# Patient Record
Sex: Female | Born: 1995 | Race: Black or African American | Hispanic: No | Marital: Single | Smoking: Never smoker
Health system: Southern US, Community
[De-identification: ages and names within clinical notes are randomized; demographics above are authoritative.]

## PROBLEM LIST (undated history)

## (undated) ENCOUNTER — Inpatient Hospital Stay (HOSPITAL_COMMUNITY): Payer: Medicaid Other

## (undated) DIAGNOSIS — O24419 Gestational diabetes mellitus in pregnancy, unspecified control: Secondary | ICD-10-CM

## (undated) DIAGNOSIS — Z789 Other specified health status: Secondary | ICD-10-CM

## (undated) HISTORY — PX: NO PAST SURGERIES: SHX2092

---

## 2014-05-19 ENCOUNTER — Emergency Department (HOSPITAL_COMMUNITY): Payer: Medicaid Other

## 2014-05-19 ENCOUNTER — Emergency Department (HOSPITAL_COMMUNITY)
Admission: EM | Admit: 2014-05-19 | Discharge: 2014-05-19 | Disposition: A | Discharge status: Home / Self Care | Payer: Medicaid Other | Attending: Emergency Medicine | Admitting: Emergency Medicine

## 2014-05-19 ENCOUNTER — Encounter (HOSPITAL_COMMUNITY): Payer: Self-pay | Admitting: Emergency Medicine

## 2014-05-19 DIAGNOSIS — S8990XA Unspecified injury of unspecified lower leg, initial encounter: Secondary | ICD-10-CM | POA: Insufficient documentation

## 2014-05-19 DIAGNOSIS — S99919A Unspecified injury of unspecified ankle, initial encounter: Secondary | ICD-10-CM | POA: Diagnosis not present

## 2014-05-19 DIAGNOSIS — Y9389 Activity, other specified: Secondary | ICD-10-CM | POA: Insufficient documentation

## 2014-05-19 DIAGNOSIS — Y92410 Unspecified street and highway as the place of occurrence of the external cause: Secondary | ICD-10-CM | POA: Diagnosis not present

## 2014-05-19 DIAGNOSIS — IMO0002 Reserved for concepts with insufficient information to code with codable children: Secondary | ICD-10-CM | POA: Diagnosis not present

## 2014-05-19 DIAGNOSIS — S99929A Unspecified injury of unspecified foot, initial encounter: Principal | ICD-10-CM
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
## 2014-05-19 NOTE — ED Notes (Signed)
Pt is not available to be triaged. Pt is talking on cell phone.
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
## 2014-05-22 NOTE — ED Provider Notes (Signed)
Medical screening examination/treatment/procedure(s) were performed by non-physician practitioner and as supervising physician I was immediately available for consultation/collaboration.   EKG Interpretation None        Mea Ozga, MD 05/22/14 0658 

## 2014-11-17 ENCOUNTER — Other Ambulatory Visit: Payer: Self-pay | Admitting: Emergency Medicine

## 2014-11-17 DIAGNOSIS — Z975 Presence of (intrauterine) contraceptive device: Secondary | ICD-10-CM

## 2014-11-22 ENCOUNTER — Other Ambulatory Visit: Payer: Medicaid Other

## 2016-11-10 ENCOUNTER — Encounter (HOSPITAL_COMMUNITY): Payer: Self-pay | Admitting: Emergency Medicine

## 2016-11-10 ENCOUNTER — Emergency Department (HOSPITAL_COMMUNITY)
Admission: EM | Admit: 2016-11-10 | Discharge: 2016-11-10 | Disposition: A | Discharge status: Home / Self Care | Payer: Medicaid Other | Attending: Emergency Medicine | Admitting: Emergency Medicine

## 2016-11-10 DIAGNOSIS — R0981 Nasal congestion: Secondary | ICD-10-CM | POA: Diagnosis present

## 2016-11-10 DIAGNOSIS — J3489 Other specified disorders of nose and nasal sinuses: Secondary | ICD-10-CM | POA: Diagnosis not present

## 2016-11-10 MED ORDER — FLUTICASONE PROPIONATE 50 MCG/ACT NA SUSP: 2.0000 | Freq: Every day | NASAL | 2 refills | Status: DC

## 2016-11-10 MED ORDER — CETIRIZINE HCL 10 MG PO TABS: 10.0000 mg | ORAL_TABLET | Freq: Every day | ORAL | 0 refills | Status: DC

## 2016-11-10 NOTE — ED Triage Notes (Signed)
Pt. Stated, I've been sneezing and having congestion since Friday.

## 2016-11-10 NOTE — Discharge Instructions (Signed)
You have been seen today for congestion and sneezing. Use the Flonase and Zyrtec daily, as directed. Stay well-hydrated. Follow up with a primary care provider for any future management of this issue.

## 2016-11-10 NOTE — ED Provider Notes (Signed)
[facility]-EMERGENCY DEPT Provider Note   CSN: [id number] Arrival date & time: 11/10/16  1013     History   Chief Complaint Chief Complaint  Patient presents with  . Nasal Congestion    HPI Lorraine Burton is a 21 y.o. female.  HPI   Lorraine Burton is a 21 y.o. female, patient with no pertinent past medical history, presenting to the ED with nasal congestion, sneezing, and rhinorrhea for the last three days. She has not tried any treatments for her symptoms. Denies fever/chills, vomiting/diarrhea, CP, SOB, HA, or any other complaints.    History reviewed. No pertinent past medical history.  There are no active problems to display for this patient.   History reviewed. No pertinent surgical history.  OB History    No data available       Home Medications    Prior to Admission medications   Medication Sig Start Date End Date Taking? Authorizing Provider  cetirizine (ZYRTEC ALLERGY) 10 MG tablet Take 1 tablet (10 mg total) by mouth daily. 11/10/16   Alexiya Franqui C Sani Madariaga, PA-C  fluticasone (FLONASE) 50 MCG/ACT nasal spray Place 2 sprays into both nostrils daily. 11/10/16   Anselm PancoastShawn C Emmarose Klinke, PA-C    Family History No family history on file.  Social History Social History  Substance Use Topics  . Smoking status: Never Smoker  . Smokeless tobacco: Never Used  . Alcohol use No     Allergies   Patient has no known allergies.   Review of Systems Review of Systems  Constitutional: Negative for chills and fever.  HENT: Positive for congestion, rhinorrhea and sneezing. Negative for ear pain, sore throat and trouble swallowing.   Respiratory: Negative for cough and shortness of breath.   Gastrointestinal: Negative for diarrhea and vomiting.  Neurological: Negative for headaches.     Physical Exam Updated Vital Signs BP 98/70 (BP Location: Left Arm)   Pulse 93   Temp 98.8 F (37.1 C) (Oral)   Resp 18   Ht 5\' 8"  (1.727 m)   Wt 63.5 kg   LMP 10/19/2016   SpO2 100%   BMI  21.29 kg/m   Physical Exam  Constitutional: She appears well-developed and well-nourished. No distress.  HENT:  Head: Normocephalic and atraumatic.  Nose: Mucosal edema and rhinorrhea present. No sinus tenderness. Right sinus exhibits no maxillary sinus tenderness and no frontal sinus tenderness. Left sinus exhibits no maxillary sinus tenderness and no frontal sinus tenderness.  Mouth/Throat: Oropharynx is clear and moist.  Eyes: Conjunctivae are normal.  Neck: Normal range of motion. Neck supple.  Cardiovascular: Normal rate and regular rhythm.   Pulmonary/Chest: Effort normal and breath sounds normal. No respiratory distress.  Lymphadenopathy:    She has no cervical adenopathy.  Neurological: She is alert.  Skin: Skin is warm and dry. She is not diaphoretic.  Psychiatric: She has a normal mood and affect. Her behavior is normal.  Nursing note and vitals reviewed.    ED Treatments / Results  Labs (all labs ordered are listed, but only abnormal results are displayed) Labs Reviewed - No data to display  EKG  EKG Interpretation None       Radiology No results found.  Procedures Procedures (including critical care time)  Medications Ordered in ED Medications - No data to display   Initial Impression / Assessment and Plan / ED Course  I have reviewed the triage vital signs and the nursing notes.  Pertinent labs & imaging results that were available during my care of  the patient were reviewed by me and considered in my medical decision making (see chart for details).      Patient presents with nasal congestion and rhinorrhea. PCP follow-up for chronic management recommended. Home management discussed.     Final Clinical Impressions(s) / ED Diagnoses   Final diagnoses:  Nasal congestion  Rhinorrhea    New Prescriptions New Prescriptions   CETIRIZINE (ZYRTEC ALLERGY) 10 MG TABLET    Take 1 tablet (10 mg total) by mouth daily.   FLUTICASONE (FLONASE) 50  MCG/ACT NASAL SPRAY    Place 2 sprays into both nostrils daily.     Anselm Pancoast, PA-C 11/10/16 1103    Gerhard Munch, MD 11/11/16 1014

## 2016-12-06 ENCOUNTER — Encounter (HOSPITAL_COMMUNITY): Payer: Self-pay | Admitting: Emergency Medicine

## 2016-12-06 ENCOUNTER — Emergency Department (HOSPITAL_COMMUNITY)
Admission: EM | Admit: 2016-12-06 | Discharge: 2016-12-06 | Disposition: A | Discharge status: Home / Self Care | Payer: No Typology Code available for payment source | Attending: Emergency Medicine | Admitting: Emergency Medicine

## 2016-12-06 DIAGNOSIS — M545 Low back pain, unspecified: Secondary | ICD-10-CM

## 2016-12-06 DIAGNOSIS — S3992XA Unspecified injury of lower back, initial encounter: Secondary | ICD-10-CM | POA: Insufficient documentation

## 2016-12-06 DIAGNOSIS — Y939 Activity, unspecified: Secondary | ICD-10-CM | POA: Diagnosis not present

## 2016-12-06 DIAGNOSIS — M546 Pain in thoracic spine: Secondary | ICD-10-CM | POA: Diagnosis not present

## 2016-12-06 DIAGNOSIS — Y999 Unspecified external cause status: Secondary | ICD-10-CM | POA: Diagnosis not present

## 2016-12-06 DIAGNOSIS — Y92410 Unspecified street and highway as the place of occurrence of the external cause: Secondary | ICD-10-CM | POA: Diagnosis not present

## 2016-12-06 MED ORDER — METHOCARBAMOL 500 MG PO TABS: 500.0000 mg | ORAL_TABLET | Freq: Every evening | ORAL | 0 refills | Status: DC | PRN

## 2016-12-06 MED ORDER — IBUPROFEN 600 MG PO TABS: 600.0000 mg | ORAL_TABLET | Freq: Four times a day (QID) | ORAL | 0 refills | Status: DC | PRN

## 2016-12-06 MED ORDER — IBUPROFEN 200 MG PO TABS
600.0000 mg | ORAL_TABLET | Freq: Once | ORAL | Status: AC
  Administered 2016-12-06: 600 mg via ORAL
  Filled 2016-12-06: qty 1

## 2016-12-06 MED ORDER — METHOCARBAMOL 500 MG PO TABS
1000.0000 mg | ORAL_TABLET | Freq: Once | ORAL | Status: AC
  Administered 2016-12-06: 1000 mg via ORAL
  Filled 2016-12-06: qty 2
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
## 2016-12-06 NOTE — Discharge Instructions (Signed)
Take Ibuprofen three times daily for the next week. Take this medicine with food. °Take muscle relaxer at bedtime to help you sleep. This medicine makes you drowsy so do not take before driving or work °Use a heating pad for sore muscles - use for 20 minutes several times a day °Return for worsening symptoms ° °
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
## 2019-08-16 ENCOUNTER — Emergency Department (HOSPITAL_COMMUNITY)
Admission: EM | Admit: 2019-08-16 | Discharge: 2019-08-17 | Discharge status: Against Medical Advice | Payer: BC Managed Care – PPO | Attending: Emergency Medicine | Admitting: Emergency Medicine

## 2019-08-16 ENCOUNTER — Other Ambulatory Visit: Payer: Self-pay

## 2019-08-16 ENCOUNTER — Encounter (HOSPITAL_COMMUNITY): Payer: Self-pay | Admitting: *Deleted

## 2019-08-16 DIAGNOSIS — Z5321 Procedure and treatment not carried out due to patient leaving prior to being seen by health care provider: Secondary | ICD-10-CM | POA: Diagnosis not present

## 2019-08-16 DIAGNOSIS — R519 Headache, unspecified: Secondary | ICD-10-CM | POA: Diagnosis present

## 2019-08-16 NOTE — ED Triage Notes (Signed)
Pt says that for 2-3 days she has had a headache, loss of taste and smell. Unsure of fever. Pt says she had contact with a coworker 10 days ago who was Covid positive, she is concerned she had COVID.   Discussed with pt to sit in designated area in waiting room.

## 2019-08-17 NOTE — ED Notes (Signed)
Pt advised this RN she is leaving facility.  Pt ambulatory with steady gait

## 2020-01-18 ENCOUNTER — Encounter (HOSPITAL_COMMUNITY): Payer: Self-pay | Admitting: Emergency Medicine

## 2020-01-18 ENCOUNTER — Emergency Department (HOSPITAL_COMMUNITY)
Admission: EM | Admit: 2020-01-18 | Discharge: 2020-01-18 | Disposition: A | Discharge status: Home / Self Care | Payer: BC Managed Care – PPO | Attending: Emergency Medicine | Admitting: Emergency Medicine

## 2020-01-18 ENCOUNTER — Other Ambulatory Visit: Payer: Self-pay

## 2020-01-18 DIAGNOSIS — M7918 Myalgia, other site: Secondary | ICD-10-CM | POA: Diagnosis present

## 2020-01-18 DIAGNOSIS — M25512 Pain in left shoulder: Secondary | ICD-10-CM | POA: Insufficient documentation

## 2020-01-18 NOTE — Discharge Instructions (Addendum)
Ibuprofen for discomfort.  Return if any problems.  
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
## 2020-05-13 ENCOUNTER — Inpatient Hospital Stay (HOSPITAL_COMMUNITY): Payer: Self-pay

## 2020-05-13 ENCOUNTER — Other Ambulatory Visit: Payer: Self-pay

## 2020-05-13 ENCOUNTER — Encounter (HOSPITAL_COMMUNITY): Payer: Self-pay | Admitting: Obstetrics and Gynecology

## 2020-05-13 ENCOUNTER — Inpatient Hospital Stay (HOSPITAL_COMMUNITY)
Admission: AD | Admit: 2020-05-13 | Discharge: 2020-05-13 | Disposition: A | Discharge status: Home / Self Care | Payer: Self-pay | Attending: Obstetrics and Gynecology | Admitting: Obstetrics and Gynecology

## 2020-05-13 DIAGNOSIS — Z3A01 Less than 8 weeks gestation of pregnancy: Secondary | ICD-10-CM | POA: Insufficient documentation

## 2020-05-13 DIAGNOSIS — O209 Hemorrhage in early pregnancy, unspecified: Secondary | ICD-10-CM

## 2020-05-13 DIAGNOSIS — O26851 Spotting complicating pregnancy, first trimester: Secondary | ICD-10-CM | POA: Insufficient documentation

## 2020-05-13 DIAGNOSIS — Z3491 Encounter for supervision of normal pregnancy, unspecified, first trimester: Secondary | ICD-10-CM

## 2020-05-13 LAB — HCG, QUANTITATIVE, PREGNANCY: hCG, Beta Chain, Quant, S: 6145 m[IU]/mL — ABNORMAL HIGH (ref <5)

## 2020-05-13 NOTE — MAU Note (Signed)
Lorraine Burton is a 23 y.o. at [redacted]w[redacted]d here in MAU reporting: was seen 2 days ago at [facility] Emergency [facility] for vaginal bleeding during pregnancy. They recommended she follow up in 48 hours for repeat hcg. States bleeding is light. Denies pain.  LMP: 03/28/20  Onset of complaint: ongoing  Pain score: 0/10  Vitals:   05/13/20 1139  BP: 102/63  Pulse: 85  Resp: 16  Temp: 98.4 F (36.9 C)  SpO2: 100%     Lab orders placed from triage: none

## 2020-05-13 NOTE — Discharge Instructions (Signed)
Safe Medications in Pregnancy   Acne: Benzoyl Peroxide Salicylic Acid  Backache/Headache: Tylenol: 2 regular strength every 4 hours OR              2 Extra strength every 6 hours  Colds/Coughs/Allergies: Benadryl (alcohol free) 25 mg every 6 hours as needed Breath right strips Claritin Cepacol throat lozenges Chloraseptic throat spray Cold-Eeze- up to three times per day Cough drops, alcohol free Flonase (by prescription only) Guaifenesin Mucinex Robitussin DM (plain only, alcohol free) Saline nasal spray/drops Sudafed (pseudoephedrine) & Actifed ** use only after [redacted] weeks gestation and if you do not have high blood pressure Tylenol Vicks Vaporub Zinc lozenges Zyrtec   Constipation: Colace Ducolax suppositories Fleet enema Glycerin suppositories Metamucil Milk of magnesia Miralax Senokot Smooth move tea  Diarrhea: Kaopectate Imodium A-D  *NO pepto Bismol  Hemorrhoids: Anusol Anusol HC Preparation H Tucks  Indigestion: Tums Maalox Mylanta Zantac  Pepcid  Insomnia: Benadryl (alcohol free) 25mg  every 6 hours as needed Tylenol PM Unisom, no Gelcaps  Leg Cramps: Tums MagGel  Nausea/Vomiting:  Bonine Dramamine Emetrol Ginger extract Sea bands Meclizine  Nausea medication to take during pregnancy:  Unisom (doxylamine succinate 25 mg tablets) Take one tablet daily at bedtime. If symptoms are not adequately controlled, the dose can be increased to a maximum recommended dose of two tablets daily (1/2 tablet in the morning, 1/2 tablet mid-afternoon and one at bedtime). Vitamin B6 100mg  tablets. Take one tablet twice a day (up to 200 mg per day).  Skin Rashes: Aveeno products Benadryl cream or 25mg  every 6 hours as needed Calamine Lotion 1% cortisone cream  Yeast infection: Gyne-lotrimin 7 Monistat 7   **If taking multiple medications, please check labels to avoid duplicating the same active ingredients **take medication as directed on  the label ** Do not exceed 4000 mg of tylenol in 24 hours **Do not take medications that contain aspirin or ibuprofen    Prenatal Care Providers           [facility] for [facility] @ [facility]   Phone: [phone]  [facility] for [facility] @ [facility]   Phone: [phone]  [facility] @[place]  [facility]       Phone: [phone]            Center for [facility] @ [place]     Phone: [phone]          Center for [id number] Healthcare @ [facility]   Phone: [phone]  [facility] for [facility] @ [facility]  Phone: [phone]  [facility] for [facility] @ [facility] Phone: [phone]     [facility]  Phone: [phone]  [facility]  Phone: [phone]  [facility] Phone: [phone]  [facility] Phone: [phone]  [facility] Phone: [phone]  [facility] Phone: [phone]  [facility]  Phone: [phone] Center for [facility] Prenatal Care Providers          Center for [facility] Healthcare locations:  Hours may vary. Please call for an appointment  [facility] for [facility] Healthcare @ [place]  [address] [place]  [phone]  [facility] for [facility] @ [facility]   [address]  [phone]  [facility] For [facility] @ [facility]       [address] [phone]            Center for [facility] Healthcare @ [place]     [phone]  [phone] °         °[facility] @ [place]  ° [address] °[phone] ° °[facility] @ [facility] ° [address] °[phone] °    °[facility] ([place]) ° [address]  ° [phone] °

## 2020-05-13 NOTE — MAU Provider Note (Signed)
History     CSN: [id number]  Arrival date and time: 05/13/20 1128  Chief Complaint  Patient presents with   Follow-up   HPI Lorraine Burton is a 24 y.o. G1P0 at [redacted]w[redacted]d who presents for a repeat HCG. She was seen on 8/19 at [facility] in [place] for vaginal bleeding. She had labs and ultrasound done there and was told to follow up in 48 hours for repeat HCG. She reports some spotting when she wipes and denies any pain at this time. All records visible in Care Everywhere  OB History     Gravida  1   Para      Term      Preterm      AB      Living         SAB      TAB      Ectopic      Multiple      Live Births              No past medical history on file.  No past surgical history on file.  No family history on file.  Social History   Tobacco Use   Smoking status: Never Smoker   Smokeless tobacco: Never Used  Substance Use Topics   Alcohol use: No   Drug use: No    Allergies: No Known Allergies  No medications prior to admission.    Review of Systems  Constitutional: Negative.  Negative for fatigue and fever.  HENT: Negative.   Respiratory: Negative.  Negative for shortness of breath.   Cardiovascular: Negative.  Negative for chest pain.  Gastrointestinal: Negative.  Negative for abdominal pain, constipation, diarrhea, nausea and vomiting.  Genitourinary: Positive for vaginal bleeding. Negative for dysuria and vaginal discharge.  Neurological: Negative.  Negative for dizziness and headaches.   Physical Exam   Blood pressure 102/63, pulse 85, temperature 98.4 F (36.9 C), temperature source Oral, resp. rate 16, height 5\' 8"  (1.727 m), weight 137 lb 9.6 oz (62.4 kg), last menstrual period 03/28/2020, SpO2 100 %.  Physical Exam Vitals and nursing note reviewed.  Constitutional:      General: She is not in acute distress.    Appearance: She is well-developed.  HENT:     Head: Normocephalic.  Eyes:     Pupils: Pupils  are equal, round, and reactive to light.  Cardiovascular:     Rate and Rhythm: Normal rate and regular rhythm.     Heart sounds: Normal heart sounds.  Pulmonary:     Effort: Pulmonary effort is normal. No respiratory distress.     Breath sounds: Normal breath sounds.  Abdominal:     General: Bowel sounds are normal. There is no distension.     Palpations: Abdomen is soft.     Tenderness: There is no abdominal tenderness.  Skin:    General: Skin is warm and dry.  Neurological:     Mental Status: She is alert and oriented to person, place, and time.  Psychiatric:        Behavior: Behavior normal.        Thought Content: Thought content normal.        Judgment: Judgment normal.     MAU Course  Procedures Results for orders placed or performed during the hospital encounter of 05/13/20 (from the past 24 hour(s))  hCG, quantitative, pregnancy     Status: Abnormal   Collection Time: 05/13/20 12:31 PM  Result Value Ref Range  hCG, Beta Chain, Quant, S 6,145 (H) <5 mIU/mL   US OB LESS THAN 14 WEEKS WITH OB TRANSVAGINAL  Result Date: 05/13/2020 CLINICAL DATA:  Vaginal bleeding. EXAM: OBSTETRIC <14 WK [place] AND TRANSVAGINAL OB US TECHNIQUE: Both transabdominal and transvaginal ultrasound examinations were performed for complete evaluation of the gestation as well as the maternal uterus, adnexal regions, and pelvic cul-de-sac. Transvaginal technique was performed to assess early pregnancy. COMPARISON:  None. FINDINGS: Intrauterine gestational sac: Single Yolk sac:  Visualized. Embryo:  Visualized. Cardiac Activity: Visualized. Heart Rate: 90 bpm MSD:   mm    w     d CRL:  1.6 mm Subchorionic hemorrhage:  None visualized. Maternal uterus/adnexae: Normal. IMPRESSION: Single live IUP Electronically Signed   By: Gerome Sam III M.D   On: 05/13/2020 15:12    MDM Records from visit on 8/19 reviewed in Care Everywhere HCG- 5117 ABO/Rh- O Pos Impression from ultrasound- 1. Saclike structure in  the uterine cavity which does not contain a fetal pole or yolk sac. Mean sac size corresponding to a 5 week 6 day gestation. Ectopic pregnancy cannot be excluded.  2. No suspicious finding in either adnexa. No free fluid.  3. Patient will need clinical follow-up and follow-up quantitative beta hCG.   Labs ordered and reviewed today HCG US OB Less 14w  Assessment and Plan   1. Normal intrauterine pregnancy on prenatal ultrasound in first trimester   2. Vaginal bleeding affecting early pregnancy   3. [redacted] weeks gestation of pregnancy    -Discharge home in stable condition -First trimester precautions discussed -Patient advised to follow-up with OB of choice to start prenatal care, list given -Patient may return to MAU as needed or if her condition were to change or worsen  Rolm Bookbinder CNM 05/13/2020, 4:58 PM

## 2020-05-18 ENCOUNTER — Encounter (HOSPITAL_COMMUNITY): Payer: Self-pay | Admitting: Obstetrics and Gynecology

## 2020-05-18 ENCOUNTER — Inpatient Hospital Stay (HOSPITAL_COMMUNITY)
Admission: AD | Admit: 2020-05-18 | Discharge: 2020-05-18 | Disposition: A | Discharge status: Home / Self Care | Payer: Self-pay | Attending: Obstetrics and Gynecology | Admitting: Obstetrics and Gynecology

## 2020-05-18 ENCOUNTER — Other Ambulatory Visit: Payer: Self-pay

## 2020-05-18 DIAGNOSIS — Z3490 Encounter for supervision of normal pregnancy, unspecified, unspecified trimester: Secondary | ICD-10-CM

## 2020-05-18 DIAGNOSIS — Z79899 Other long term (current) drug therapy: Secondary | ICD-10-CM | POA: Insufficient documentation

## 2020-05-18 DIAGNOSIS — O98311 Other infections with a predominantly sexual mode of transmission complicating pregnancy, first trimester: Secondary | ICD-10-CM

## 2020-05-18 DIAGNOSIS — O209 Hemorrhage in early pregnancy, unspecified: Secondary | ICD-10-CM | POA: Insufficient documentation

## 2020-05-18 DIAGNOSIS — Z3A01 Less than 8 weeks gestation of pregnancy: Secondary | ICD-10-CM

## 2020-05-18 DIAGNOSIS — A599 Trichomoniasis, unspecified: Secondary | ICD-10-CM

## 2020-05-18 LAB — CBC
HCT: 33.6 % — ABNORMAL LOW (ref 36.0–46.0)
Hemoglobin: 11.1 g/dL — ABNORMAL LOW (ref 12.0–15.0)
MCH: 32.6 pg (ref 26.0–34.0)
MCHC: 33 g/dL (ref 30.0–36.0)
MCV: 98.5 fL (ref 80.0–100.0)
Platelets: 176 10*3/uL (ref 150–400)
RBC: 3.41 MIL/uL — ABNORMAL LOW (ref 3.87–5.11)
RDW: 11.7 % (ref 11.5–15.5)
WBC: 6.4 10*3/uL (ref 4.0–10.5)
nRBC: 0 % (ref 0.0–0.2)

## 2020-05-18 LAB — URINALYSIS, ROUTINE W REFLEX MICROSCOPIC
Bilirubin Urine: NEGATIVE
Glucose, UA: NEGATIVE mg/dL
Ketones, ur: 5 mg/dL — AB
Nitrite: NEGATIVE
Protein, ur: 100 mg/dL — AB
RBC / HPF: >50 RBC/hpf — ABNORMAL HIGH (ref 0–5)
Specific Gravity, Urine: 1.027 (ref 1.005–1.030)
WBC, UA: >50 WBC/hpf — ABNORMAL HIGH (ref 0–5)
pH: 5 (ref 5.0–8.0)

## 2020-05-18 LAB — WET PREP, GENITAL
Clue Cells Wet Prep HPF POC: NONE SEEN
Sperm: NONE SEEN
Yeast Wet Prep HPF POC: NONE SEEN

## 2020-05-18 LAB — HCG, QUANTITATIVE, PREGNANCY: hCG, Beta Chain, Quant, S: 7026 m[IU]/mL — ABNORMAL HIGH (ref <5)

## 2020-05-18 MED ORDER — METRONIDAZOLE 500 MG PO TABS
2000.0000 mg | ORAL_TABLET | Freq: Once | ORAL | Status: AC
  Administered 2020-05-18: 2000 mg via ORAL
  Filled 2020-05-18: qty 4

## 2020-05-18 MED ORDER — METRONIDAZOLE 500 MG PO TABS
500.0000 mg | ORAL_TABLET | Freq: Two times a day (BID) | ORAL | Status: DC
  Filled 2020-05-18: qty 1

## 2020-05-18 NOTE — MAU Note (Signed)
. °.  Lorraine Burton is a 24 y.o. at [redacted]w[redacted]d here in MAU reporting: Spotting that started last Thursday morning. She was seen in MAU on Saturday but states the bleeding is concerning and wanted to be sure that everything was still good. No abnormal discharge other than the spotting. Last intercourse was last Wednesday. No pain.  Pain score: 0 Vitals:   05/18/20 1014  BP: 113/65  Pulse: 83  Resp: 15  Temp: 99 F (37.2 C)  SpO2: 100%      Lab orders placed from triage:  UA

## 2020-05-18 NOTE — Discharge Instructions (Signed)
Vaginal Bleeding During Pregnancy, First Trimester  A small amount of bleeding (spotting) from the vagina is common during early pregnancy. Sometimes the bleeding is normal and does not cause problems. At other times, though, bleeding may be a sign of something serious. Tell your doctor about any bleeding from your vagina right away. Follow these instructions at home: Activity  Follow your doctor's instructions about how active you can be.  If needed, make plans for someone to help with your normal activities.  Do not have sex or orgasms until your doctor says that this is safe. General instructions  Take over-the-counter and prescription medicines only as told by your doctor.  Watch your condition for any changes.  Write down: ? The number of pads you use each day. ? How often you change pads. ? How soaked (saturated) your pads are.  Do not use tampons.  Do not douche.  If you pass any tissue from your vagina, save it to show to your doctor.  Keep all follow-up visits as told by your doctor. This is important. Contact a doctor if:  You have vaginal bleeding at any time while you are pregnant.  You have cramps.  You have a fever. Get help right away if:  You have very bad cramps in your back or belly (abdomen).  You pass large clots or a lot of tissue from your vagina.  Your bleeding gets worse.  You feel light-headed.  You feel weak.  You pass out (faint).  You have chills.  You are leaking fluid from your vagina.  You have a gush of fluid from your vagina. Summary  Sometimes vaginal bleeding during pregnancy is normal and does not cause problems. At other times, bleeding may be a sign of something serious.  Tell your doctor about any bleeding from your vagina right away.  Follow your doctor's instructions about how active you can be. You may need someone to help you with your normal activities. This information is not intended to replace advice given to  you by your health care provider. Make sure you discuss any questions you have with your health care provider. Document Revised: 12/29/2018 Document Reviewed: 12/11/2016 Elsevier Patient Education  2020 [employer].  Trichomonas Test Why am I having this test? The trichomonas test is done to diagnose trichomoniasis, a sexually-transmitted infection (STI) caused by an organism called Trichomonas. You may have this test as a part of a routine screening for STIs or if you have symptoms of trichomoniasis. What kind of sample is taken? To perform the test, your health care provider will either ask you to provide a urine sample or take a sample of discharge. The sample will be taken from the vagina or cervix in women and from the urethra in men. How are the results reported? Your test results will be reported as either positive or negative. It is up to you to get your test results. Ask your health care provider, or the department that is doing the test, when your results will be ready. What do the results mean? Negative test result A negative result means that you do not have trichomoniasis. Follow your health care provider's directions about any follow-up testing. Positive test result A positive result means that you have an active infection that needs to be treated with antibiotic medicine. All your current sexual partners must also be treated. If they are not, you will likely get reinfected. If your test result is positive, your health care provider will start you  on medicine and may advise you:  Not to have sex until your infection has cleared up.  To use a latex condom properly every time you have sex.  To limit the number of sexual partners you have. The more partners you have, the greater your risk of contracting trichomoniasis or another STI.  To tell all sexual partners about your infection so that they can also get tested and treated. This will prevent reinfection. Talk with your  health care provider to discuss your results, treatment options, and if necessary, the need for more tests. Talk with your health care provider if you have any questions about your results. This information is not intended to replace advice given to you by your health care provider. Make sure you discuss any questions you have with your health care provider. Document Revised: 08/22/2017 Document Reviewed: 11/05/2016 Elsevier Patient Education  2020 [employer].

## 2020-05-18 NOTE — MAU Provider Note (Signed)
History     CSN: [id number]  Arrival date and time: 05/18/20 1002   First Provider Initiated Contact with Patient 05/18/20 1212      Chief Complaint  Patient presents with  . Vaginal Bleeding   HPI Lorraine Burton is a 24 y.o. G2P1001 with confirmed IUP at [redacted]w[redacted]d who presents to MAU with chief complaint of vaginal spotting.  This is a recurrent problem, onset 05/11/2020. Patient states her initial spotting gradually lightened to pink-tinged discharge then returned to dark reddish brown spotting. She denies abdominal pain, dysuria, fever or recent illness. She is remote from sexual intercourse.  OB History    Gravida  2   Para  1   Term  1   Preterm      AB      Living  1     SAB      TAB      Ectopic      Multiple      Live Births  1           History reviewed. No pertinent past medical history.  History reviewed. No pertinent surgical history.  Family History  Problem Relation Age of Onset  . Hypertension Mother     Social History   Tobacco Use  . Smoking status: Never Smoker  . Smokeless tobacco: Never Used  Vaping Use  . Vaping Use: Never used  Substance Use Topics  . Alcohol use: No  . Drug use: No    Allergies: No Known Allergies  Medications Prior to Admission  Medication Sig Dispense Refill Last Dose  . Prenatal Vit-Fe Fumarate-FA (PRENATAL MULTIVITAMIN) TABS tablet Take 1 tablet by mouth daily at 12 noon.   Past Week at Unknown time  . cetirizine (ZYRTEC ALLERGY) 10 MG tablet Take 1 tablet (10 mg total) by mouth daily. 30 tablet 0 More than a month at Unknown time  . fluticasone (FLONASE) 50 MCG/ACT nasal spray Place 2 sprays into both nostrils daily. 16 g 2 More than a month at Unknown time    Review of Systems  Genitourinary: Positive for vaginal bleeding.  All other systems reviewed and are negative.  Physical Exam   Blood pressure 106/65, pulse 80, temperature 99 F (37.2 C), temperature source Oral, resp. rate 14, height  5\' 8"  (1.727 m), weight 62 kg, last menstrual period 03/28/2020, SpO2 100 %.  Physical Exam Vitals and nursing note reviewed. Exam conducted with a chaperone present.  Cardiovascular:     Rate and Rhythm: Normal rate.  Pulmonary:     Effort: Pulmonary effort is normal.     Breath sounds: Normal breath sounds.  Abdominal:     General: Abdomen is flat. Bowel sounds are normal. There is no distension.     Palpations: Abdomen is soft.     Tenderness: There is no abdominal tenderness. There is no right CVA tenderness or left CVA tenderness.  Genitourinary:    Vagina: Vaginal discharge present.     Comments: Scant whitish green discharge throughout vault. No bleeding observed Skin:    General: Skin is warm.     Capillary Refill: Capillary refill takes less than 2 seconds.  Psychiatric:        Mood and Affect: Mood normal.     MAU Course  Procedures: speculum exam  --IUP confirmed 05/13/2020 --Discussed CDC regimen for Trichomonas. Pt verbalizes she is afraid she will forget if given multi-day dose and verbalizes preference for single dose in MAU --Abnormal UA without dysuria. Concern  from contamination by vaginal spotting. Will send for culture  Orders Placed This Encounter  Procedures  . Wet prep, genital  . Culture, OB Urine  . Urinalysis, Routine w reflex microscopic Urine, Clean Catch  . CBC  . hCG, quantitative, pregnancy  . Discharge patient   Results for orders placed or performed during the hospital encounter of 05/18/20 (from the past 24 hour(s))  Urinalysis, Routine w reflex microscopic Urine, Clean Catch     Status: Abnormal   Collection Time: 05/18/20 10:35 AM  Result Value Ref Range   Color, Urine AMBER (A) YELLOW   APPearance CLOUDY (A) CLEAR   Specific Gravity, Urine 1.027 1.005 - 1.030   pH 5.0 5.0 - 8.0   Glucose, UA NEGATIVE NEGATIVE mg/dL   Hgb urine dipstick LARGE (A) NEGATIVE   Bilirubin Urine NEGATIVE NEGATIVE   Ketones, ur 5 (A) NEGATIVE mg/dL    Protein, ur 657 (A) NEGATIVE mg/dL   Nitrite NEGATIVE NEGATIVE   Leukocytes,Ua MODERATE (A) NEGATIVE   RBC / HPF >50 (H) 0 - 5 RBC/hpf   WBC, UA >50 (H) 0 - 5 WBC/hpf   Bacteria, UA RARE (A) NONE SEEN   Squamous Epithelial / LPF 11-20 0 - 5   WBC Clumps PRESENT    Mucus PRESENT   Wet prep, genital     Status: Abnormal   Collection Time: 05/18/20 10:35 AM   Specimen: Vaginal  Result Value Ref Range   Yeast Wet Prep HPF POC NONE SEEN NONE SEEN   Trich, Wet Prep PRESENT (A) NONE SEEN   Clue Cells Wet Prep HPF POC NONE SEEN NONE SEEN   WBC, Wet Prep HPF POC MODERATE (A) NONE SEEN   Sperm NONE SEEN   CBC     Status: Abnormal   Collection Time: 05/18/20 10:51 AM  Result Value Ref Range   WBC 6.4 4.0 - 10.5 K/uL   RBC 3.41 (L) 3.87 - 5.11 MIL/uL   Hemoglobin 11.1 (L) 12.0 - 15.0 g/dL   HCT 84.6 (L) 36 - 46 %   MCV 98.5 80.0 - 100.0 fL   MCH 32.6 26.0 - 34.0 pg   MCHC 33.0 30.0 - 36.0 g/dL   RDW 96.2 95.2 - 84.1 %   Platelets 176 150 - 400 K/uL   nRBC 0.0 0.0 - 0.2 %  hCG, quantitative, pregnancy     Status: Abnormal   Collection Time: 05/18/20 10:51 AM  Result Value Ref Range   hCG, Beta Chain, Quant, S 7,026 (H) <5 mIU/mL   Meds ordered this encounter  Medications  . metroNIDAZOLE (FLAGYL) tablet 2,000 mg    Assessment and Plan  --24 y.o. G2P1001 at [redacted]w[redacted]d  --+ Trichomonas, treated in MAU --Paper rx expedited partner therapy --Blood type O POS per records in Care Everywhere --Urine culture in work --Discharge home in stable condition   F/U: --Patient to choose [facility] Provider, establish care  Calvert Cantor, CNM 05/18/2020, 2:27 PM

## 2020-05-19 LAB — CULTURE, OB URINE: Culture: <10000 — AB

## 2020-05-19 LAB — GC/CHLAMYDIA PROBE AMP (~~LOC~~) NOT AT ARMC
Chlamydia: NEGATIVE
Comment: NEGATIVE
Comment: NORMAL
Neisseria Gonorrhea: NEGATIVE

## 2020-05-20 ENCOUNTER — Other Ambulatory Visit: Payer: Self-pay

## 2020-05-20 ENCOUNTER — Inpatient Hospital Stay (HOSPITAL_COMMUNITY)
Admission: AD | Admit: 2020-05-20 | Discharge: 2020-05-20 | Disposition: A | Discharge status: Home / Self Care | Payer: Self-pay | Attending: Family Medicine | Admitting: Family Medicine

## 2020-05-20 DIAGNOSIS — A599 Trichomoniasis, unspecified: Secondary | ICD-10-CM | POA: Insufficient documentation

## 2020-05-20 DIAGNOSIS — Z3A01 Less than 8 weeks gestation of pregnancy: Secondary | ICD-10-CM | POA: Insufficient documentation

## 2020-05-20 DIAGNOSIS — Z09 Encounter for follow-up examination after completed treatment for conditions other than malignant neoplasm: Secondary | ICD-10-CM

## 2020-05-20 DIAGNOSIS — O98311 Other infections with a predominantly sexual mode of transmission complicating pregnancy, first trimester: Secondary | ICD-10-CM | POA: Insufficient documentation

## 2020-05-20 NOTE — MAU Provider Note (Signed)
First Provider Initiated Contact with Patient 05/20/20 1924      Lorraine Burton is a 24 y.o. G2P1001 at [redacted]w[redacted]d who presents stating she needs repeat labs. She denies any pain. Reports intermittent spotting but not new from her visit on 8/26.  O BP (!) 100/47 (BP Location: Right Arm)   Pulse 80   Temp 99.2 F (37.3 C) (Oral)   Resp 16   LMP 03/28/2020   SpO2 100%  Physical Exam Vitals and nursing note reviewed.  Constitutional:      General: She is not in acute distress.    Appearance: She is well-developed.  HENT:     Head: Normocephalic.  Eyes:     Pupils: Pupils are equal, round, and reactive to light.  Cardiovascular:     Rate and Rhythm: Normal rate and regular rhythm.     Heart sounds: Normal heart sounds.  Pulmonary:     Effort: Pulmonary effort is normal. No respiratory distress.     Breath sounds: Normal breath sounds.  Abdominal:     General: Bowel sounds are normal. There is no distension.     Palpations: Abdomen is soft.     Tenderness: There is no abdominal tenderness.  Skin:    General: Skin is warm and dry.  Neurological:     Mental Status: She is alert and oriented to person, place, and time.  Psychiatric:        Behavior: Behavior normal.        Thought Content: Thought content normal.        Judgment: Judgment normal.     A Medical screening exam complete 1. Follow up   2. [redacted] weeks gestation of pregnancy   3. Trichomonas infection    Per chart review, patient is to establish prenatal care and does not need repeat labs. Reviewed with patient and patient verbalized understanding.   P Discharge from MAU in stable condition List of options for follow-up given  Warning signs for worsening condition that would warrant emergency follow-up discussed Patient may return to MAU as needed for pregnancy related complaints  Rolm Bookbinder, CNM 05/20/2020 7:24 PM

## 2020-05-20 NOTE — Discharge Instructions (Signed)

## 2020-05-20 NOTE — MAU Note (Signed)
Lorraine Burton is a 24 y.o. at [redacted]w[redacted]d here in MAU reporting: for follow up labs Pain score: 0

## 2020-06-01 ENCOUNTER — Other Ambulatory Visit: Payer: Self-pay

## 2020-06-01 ENCOUNTER — Ambulatory Visit
Admission: RE | Admit: 2020-06-01 | Discharge: 2020-06-01 | Disposition: A | Discharge status: Home / Self Care | Payer: Self-pay | Source: Ambulatory Visit

## 2020-06-01 DIAGNOSIS — Z3A01 Less than 8 weeks gestation of pregnancy: Secondary | ICD-10-CM | POA: Insufficient documentation

## 2020-06-01 DIAGNOSIS — Z09 Encounter for follow-up examination after completed treatment for conditions other than malignant neoplasm: Secondary | ICD-10-CM | POA: Insufficient documentation

## 2020-09-23 NOTE — L&D Delivery Note (Addendum)
OB/GYN Faculty Practice Delivery Note  Lorraine Burton is a 25 y.o. G3P1011 at [redacted]w[redacted]d admitted for IOL for A1GDM.   GBS Status: Positive/-- (10/13 1636) Maximum Maternal Temperature: 98.4  Labor course: Initial SVE: 3.5 cm. Augmentation with: Pitocin. She then progressed to complete.  ROM: 0h 35m with clear fluid  Birth: At 1507 a viable female was delivered via spontaneous vaginal delivery (Presentation: OA). Nuchal cord present: No.  Shoulders and body delivered in usual fashion. Infant placed directly on mom's abdomen for bonding/skin-to-skin, baby dried and stimulated. Cord clamped x 2 after 1 minute and cut by patient.  Cord blood collected.  The placenta separated spontaneously and delivered via gentle cord traction.  Pitocin infused rapidly IV per protocol.  Fundus firm with massage.  Placenta inspected and appears to be intact with a 3 VC.  Placenta/Cord with the following complications: non .  Cord pH: n/a Sponge and instrument count were correct x2.  Intrapartum complications:  None Anesthesia:  none Episiotomy: none Lacerations:  2nd degree and vaginal Suture Repair: 2.0 vicryl rapide EBL (mL): 150   Infant: APGAR (1 MIN): 9   APGAR (5 MINS): 9   Infant weight: pending  Mom to postpartum.  Baby to Couplet care / Skin to Skin. Placenta to L&D   Plans to Breastfeed Contraception:  unsure Circumcision: wants inpatient  Note sent to [facility]: [facility] for pp visit.  Raelyn Mora , MSN, CNM 07/21/2021 3:41 PM

## 2020-10-18 ENCOUNTER — Ambulatory Visit (HOSPITAL_COMMUNITY)
Admission: AD | Admit: 2020-10-18 | Discharge: 2020-10-18 | Disposition: A | Discharge status: Home / Self Care | Payer: Self-pay | Attending: Psychiatry | Admitting: Psychiatry
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
## 2020-10-18 NOTE — H&P (Signed)
Behavioral Health Medical Screening Exam  Lorraine Burton is an 25 y.o. female. She is presenting for increased anxiety and depression. She is tearful on assessment. She had a miscarriage in September 2021 and reports depressed mood and isolating from others since that time. She has also been out of work and looking for a job. Due to financial issues she is facing potential eviction from her home. She reports yesterday she was experiencing SI with no plan or intent. Denies SI today and contracts for safety. Denies HI/AVH. She shows no signs of responding to internal stimuli. No prior suicide attempts or hospitalizations. Denies prior mental health treatment. She identifies her son as a reason for living. Denies drug/alcohol use. Discussed recommendation for PHP with patient. Patient expresses interest in PHP at [facility] and provided with follow up information.  Total Time spent with patient: 30 minutes  Psychiatric Specialty Exam: Physical Exam Vitals reviewed.  Constitutional:      Appearance: She is well-developed and well-nourished.  Cardiovascular:     Rate and Rhythm: Normal rate.  Pulmonary:     Effort: Pulmonary effort is normal.  Neurological:     Mental Status: She is alert and oriented to person, place, and time.    Review of Systems  Constitutional: Negative.   Respiratory: Negative for cough and shortness of breath.   Gastrointestinal: Negative for diarrhea, nausea and vomiting.  Neurological: Negative for tremors and headaches.  Psychiatric/Behavioral: Positive for dysphoric mood. Negative for agitation, behavioral problems, confusion, decreased concentration, hallucinations, self-injury and suicidal ideas. The patient is nervous/anxious. The patient is not hyperactive.    Blood pressure 113/75, pulse 81, temperature 98.6 F (37 C), temperature source Oral, resp. rate 18, last menstrual period 03/28/2020, SpO2 100 %.There is no height or weight on file to calculate BMI. General  Appearance: Casual Eye Contact:  Good Speech:  Normal Rate Volume:  Normal Mood:  Anxious and Depressed Affect:  Congruent and Tearful Thought Process:  Coherent Orientation:  Full (Time, Place, and Person) Thought Content:  Logical Suicidal Thoughts:  No Homicidal Thoughts:  No Memory:  Immediate;   Good Recent;   Good Remote;   Good Judgement:  Intact Insight:  Fair Psychomotor Activity:  Normal Concentration: Concentration: Good and Attention Span: Good Recall:  Good Fund of Knowledge:Fair Language: Good Akathisia:  No Handed:  Right AIMS (if indicated):    Assets:  Communication Skills Desire for Improvement Leisure Time Physical Health Social Support Sleep:     Musculoskeletal: Strength & Muscle Tone: within normal limits Gait & Station: normal Patient leans: N/A  Blood pressure 113/75, pulse 81, temperature 98.6 F (37 C), temperature source Oral, resp. rate 18, last menstrual period 03/28/2020, SpO2 100 %.  Recommendations: Based on my evaluation the patient does not appear to have an emergency medical condition.  Partial hospitalization program.  Aldean Baker, NP 10/18/2020, 5:12 PM

## 2020-11-12 ENCOUNTER — Inpatient Hospital Stay (HOSPITAL_COMMUNITY)
Admission: AD | Admit: 2020-11-12 | Discharge: 2020-11-12 | Disposition: A | Discharge status: Home / Self Care | Payer: Medicaid Other | Attending: Obstetrics & Gynecology | Admitting: Obstetrics & Gynecology

## 2020-11-12 ENCOUNTER — Other Ambulatory Visit: Payer: Self-pay

## 2020-11-12 ENCOUNTER — Encounter (HOSPITAL_COMMUNITY): Payer: Self-pay | Admitting: Obstetrics & Gynecology

## 2020-11-12 DIAGNOSIS — R109 Unspecified abdominal pain: Secondary | ICD-10-CM | POA: Diagnosis present

## 2020-11-12 DIAGNOSIS — Z3202 Encounter for pregnancy test, result negative: Secondary | ICD-10-CM | POA: Diagnosis not present

## 2020-11-12 LAB — POCT PREGNANCY, URINE: Preg Test, Ur: NEGATIVE

## 2020-11-12 NOTE — MAU Note (Signed)
Lorraine Burton is a 25 y.o. here in MAU reporting: intermittent cramping for the past few days. States cycles have not been regular since miscarriage in September. Had some pink discharge earlier in the week but none since then. Negative UPT at home.  LMP: 09/29/20  Onset of complaint: ongoing  Pain score: 0/10  Vitals:   11/12/20 1354  BP: (!) 93/46  Pulse: 70  Resp: 16  Temp: 99.2 F (37.3 C)  SpO2: 100%     Lab orders placed from triage: UPT

## 2020-11-12 NOTE — MAU Provider Note (Signed)
Event Date/Time   First Provider Initiated Contact with Patient 11/12/20 1406     S Ms. Lorraine Burton is a 25 y.o. G2P1001 non-pregnant female who presents to MAU today with complaint of mild cramping. Her LMP is 09/29/20, but she had a negative pregnancy test. Pregnancy is desired so she is concerned about her late period and wanted confirmation. No other physical symptoms, no vaginal bleeding.   O BP (!) 93/46 (BP Location: Right Arm)   Pulse 70   Temp 99.2 F (37.3 C) (Oral)   Resp 16   Ht 5\' 8"  (1.727 m)   Wt 131 lb 8 oz (59.6 kg)   LMP 09/29/2020   SpO2 100% Comment: room air  Breastfeeding Unknown   BMI 19.99 kg/m  Physical Exam Vitals and nursing note reviewed.  Constitutional:      General: She is not in acute distress.    Appearance: She is well-developed and normal weight. She is not ill-appearing.  Eyes:     Pupils: Pupils are equal, round, and reactive to light.  Cardiovascular:     Rate and Rhythm: Normal rate.  Pulmonary:     Effort: Pulmonary effort is normal.  Musculoskeletal:        General: Normal range of motion.  Skin:    General: Skin is warm and dry.     Capillary Refill: Capillary refill takes less than 2 seconds.  Neurological:     Mental Status: She is alert and oriented to person, place, and time.  Psychiatric:        Mood and Affect: Mood normal.        Behavior: Behavior normal.        Thought Content: Thought content normal.        Judgment: Judgment normal.    Results for orders placed or performed during the hospital encounter of 11/12/20 (from the past 24 hour(s))  Pregnancy, urine POC     Status: None   Collection Time: 11/12/20  1:47 PM  Result Value Ref Range   Preg Test, Ur NEGATIVE NEGATIVE   A Negative urine pregnancy test Medical screening exam complete  P Discharge from MAU in stable condition Warning signs for worsening condition that would warrant emergency follow-up discussed Patient may return to MAU as needed for  emergent OB/GYN related complaints  11/14/20, CNM 11/12/2020 2:25 PM

## 2020-11-30 ENCOUNTER — Inpatient Hospital Stay (HOSPITAL_COMMUNITY)
Admission: AD | Admit: 2020-11-30 | Discharge: 2020-11-30 | Disposition: A | Discharge status: Home / Self Care | Payer: Medicaid Other | Attending: Obstetrics and Gynecology | Admitting: Obstetrics and Gynecology

## 2020-11-30 ENCOUNTER — Other Ambulatory Visit: Payer: Self-pay

## 2020-11-30 ENCOUNTER — Encounter (HOSPITAL_COMMUNITY): Payer: Self-pay | Admitting: Obstetrics and Gynecology

## 2020-11-30 DIAGNOSIS — Z3A01 Less than 8 weeks gestation of pregnancy: Secondary | ICD-10-CM | POA: Insufficient documentation

## 2020-11-30 DIAGNOSIS — N930 Postcoital and contact bleeding: Secondary | ICD-10-CM

## 2020-11-30 DIAGNOSIS — N939 Abnormal uterine and vaginal bleeding, unspecified: Secondary | ICD-10-CM

## 2020-11-30 DIAGNOSIS — N96 Recurrent pregnancy loss: Secondary | ICD-10-CM | POA: Insufficient documentation

## 2020-11-30 DIAGNOSIS — Z3490 Encounter for supervision of normal pregnancy, unspecified, unspecified trimester: Secondary | ICD-10-CM

## 2020-11-30 DIAGNOSIS — R109 Unspecified abdominal pain: Secondary | ICD-10-CM | POA: Insufficient documentation

## 2020-11-30 DIAGNOSIS — O26891 Other specified pregnancy related conditions, first trimester: Secondary | ICD-10-CM | POA: Insufficient documentation

## 2020-11-30 DIAGNOSIS — O26851 Spotting complicating pregnancy, first trimester: Secondary | ICD-10-CM | POA: Insufficient documentation

## 2020-11-30 DIAGNOSIS — O99891 Other specified diseases and conditions complicating pregnancy: Secondary | ICD-10-CM

## 2020-11-30 HISTORY — DX: Other specified health status: Z78.9

## 2020-11-30 LAB — URINALYSIS, ROUTINE W REFLEX MICROSCOPIC
Bilirubin Urine: NEGATIVE
Glucose, UA: NEGATIVE mg/dL
Ketones, ur: 5 mg/dL — AB
Nitrite: NEGATIVE
Protein, ur: NEGATIVE mg/dL
Specific Gravity, Urine: 1.024 (ref 1.005–1.030)
pH: 5 (ref 5.0–8.0)

## 2020-11-30 LAB — HCG, QUANTITATIVE, PREGNANCY: hCG, Beta Chain, Quant, S: 22298 m[IU]/mL — ABNORMAL HIGH (ref <5)

## 2020-11-30 NOTE — MAU Note (Incomplete)
Pt reports she is having some spotting. Went to [facility] in [place] on Sunday and was told she is [redacted]  Weeks pregnant.  Spotting started on Saturday and had little today when she wiped. Has had some mild cramping on and off for a few days. None right now.

## 2020-11-30 NOTE — Discharge Instructions (Signed)
https://www.cdc.gov/pregnancy/infections.html">  First Trimester of Pregnancy  The first trimester of pregnancy starts on the first day of your last menstrual period until the end of week 12. This is also called months 1 through 3 of pregnancy. Body changes during your first trimester Your body goes through many changes during pregnancy. The changes usually return to normal after your baby is born. Physical changes  You may gain or lose weight.  Your breasts may grow larger and hurt. The area around your nipples may get darker.  Dark spots or blotches may develop on your face.  You may have changes in your hair. Health changes  You may feel like you might vomit (nauseous), and you may vomit.  You may have heartburn.  You may have headaches.  You may have trouble pooping (constipation).  Your gums may bleed. Other changes  You may get tired easily.  You may pee (urinate) more often.  Your menstrual periods will stop.  You may not feel hungry.  You may want to eat certain kinds of food.  You may have changes in your emotions from day to day.  You may have more dreams. Follow these instructions at home: Medicines  Take over-the-counter and prescription medicines only as told by your doctor. Some medicines are not safe during pregnancy.  Take a prenatal vitamin that contains at least 600 micrograms (mcg) of folic acid. Eating and drinking  Eat healthy meals that include: ? Fresh fruits and vegetables. ? Whole grains. ? Good sources of protein, such as meat, eggs, or tofu. ? Low-fat dairy products.  Avoid raw meat and unpasteurized juice, milk, and cheese.  If you feel like you may vomit, or you vomit: ? Eat 4 or 5 small meals a day instead of 3 large meals. ? Try eating a few soda crackers. ? Drink liquids between meals instead of during meals.  You may need to take these actions to prevent or treat trouble pooping: ? Drink enough fluids to keep your pee  (urine) pale yellow. ? Eat foods that are high in fiber. These include beans, whole grains, and fresh fruits and vegetables. ? Limit foods that are high in fat and sugar. These include fried or sweet foods. Activity  Exercise only as told by your doctor. Most people can do their usual exercise routine during pregnancy.  Stop exercising if you have cramps or pain in your lower belly (abdomen) or low back.  Do not exercise if it is too hot or too humid, or if you are in a place of great height (high altitude).  Avoid heavy lifting.  If you choose to, you may have sex unless your doctor tells you not to. Relieving pain and discomfort  Wear a good support bra if your breasts are sore.  Rest with your legs raised (elevated) if you have leg cramps or low back pain.  If you have bulging veins (varicose veins) in your legs: ? Wear support hose as told by your doctor. ? Raise your feet for 15 minutes, 3-4 times a day. ? Limit salt in your food. Safety  Wear your seat belt at all times when you are in a car.  Talk with your doctor if someone is hurting you or yelling at you.  Talk with your doctor if you are feeling sad or have thoughts of hurting yourself. Lifestyle  Do not use hot tubs, steam rooms, or saunas.  Do not douche. Do not use tampons or scented sanitary pads.  Do not   use herbal medicines, illegal drugs, or medicines that are not approved by your doctor. Do not drink alcohol.  Do not smoke or use any products that contain nicotine or tobacco. If you need help quitting, ask your doctor.  Avoid cat litter boxes and soil that is used by cats. These carry germs that can cause harm to the baby and can cause a loss of your baby by miscarriage or stillbirth. General instructions  Keep all follow-up visits. This is important.  Ask for help if you need counseling or if you need help with nutrition. Your doctor can give you advice or tell you where to go for help.  Visit your  dentist. At home, brush your teeth with a soft toothbrush. Floss gently.  Write down your questions. Take them to your prenatal visits. Where to find more information  American Pregnancy Association: americanpregnancy.org  American College of Obstetricians and Gynecologists: www.acog.org  Office on Women's Health: womenshealth.gov/pregnancy Contact a doctor if:  You are dizzy.  You have a fever.  You have mild cramps or pressure in your lower belly.  You have a nagging pain in your belly area.  You continue to feel like you may vomit, you vomit, or you have watery poop (diarrhea) for 24 hours or longer.  You have a bad-smelling fluid coming from your vagina.  You have pain when you pee.  You are exposed to a disease that spreads from person to person, such as chickenpox, measles, Zika virus, HIV, or hepatitis. Get help right away if:  You have spotting or bleeding from your vagina.  You have very bad belly cramping or pain.  You have shortness of breath or chest pain.  You have any kind of injury, such as from a fall or a car crash.  You have new or increased pain, swelling, or redness in an arm or leg. Summary  The first trimester of pregnancy starts on the first day of your last menstrual period until the end of week 12 (months 1 through 3).  Eat 4 or 5 small meals a day instead of 3 large meals.  Do not smoke or use any products that contain nicotine or tobacco. If you need help quitting, ask your doctor.  Keep all follow-up visits. This information is not intended to replace advice given to you by your health care provider. Make sure you discuss any questions you have with your health care provider. Document Revised: 02/16/2020 Document Reviewed: 12/23/2019 Elsevier Patient Education  2021 [employer]vier Inc.  

## 2020-11-30 NOTE — MAU Provider Note (Signed)
Event Date/Time   First Provider Initiated Contact with Patient 11/30/20 1853     S Ms. Debbora Ang is a 25 y.o. G64P1011 pregnant female at [redacted]w[redacted]d who presents to MAU today with complaint of very light vaginal spotting and very mild cramping. The cramping is not consistent, and the spotting is only when she wipes. She has a confirmed IUP per an ultrasound on 11/27/20 and has been assured that the spotting/cramping is normal in pregnancy but is still concerned. She had an early miscarriage with her last pregnancy, but states this spotting/cramping is nothing like what she experienced. Her last quant was ~11,300.  O BP 115/66   Pulse 83   Temp 98.3 F (36.8 C)   Resp 18   Ht 5\' 8"  (1.727 m)   Wt 126 lb (57.2 kg)   LMP 10/21/2020   BMI 19.16 kg/m  Physical Exam Vitals and nursing note reviewed.  Constitutional:      General: She is not in acute distress.    Appearance: She is not ill-appearing.  Eyes:     Pupils: Pupils are equal, round, and reactive to light.  Cardiovascular:     Rate and Rhythm: Normal rate.     Pulses: Normal pulses.  Pulmonary:     Effort: Pulmonary effort is normal.  Neurological:     Mental Status: She is alert.    Results for orders placed or performed during the hospital encounter of 11/30/20 (from the past 24 hour(s))  Urinalysis, Routine w reflex microscopic Urine, Clean Catch     Status: Abnormal   Collection Time: 11/30/20  6:20 PM  Result Value Ref Range   Color, Urine YELLOW YELLOW   APPearance HAZY (A) CLEAR   Specific Gravity, Urine 1.024 1.005 - 1.030   pH 5.0 5.0 - 8.0   Glucose, UA NEGATIVE NEGATIVE mg/dL   Hgb urine dipstick MODERATE (A) NEGATIVE   Bilirubin Urine NEGATIVE NEGATIVE   Ketones, ur 5 (A) NEGATIVE mg/dL   Protein, ur NEGATIVE NEGATIVE mg/dL   Nitrite NEGATIVE NEGATIVE   Leukocytes,Ua TRACE (A) NEGATIVE   RBC / HPF 0-5 0 - 5 RBC/hpf   WBC, UA 6-10 0 - 5 WBC/hpf   Bacteria, UA RARE (A) NONE SEEN   Squamous Epithelial /  LPF 11-20 0 - 5   Mucus PRESENT    Ca Oxalate Crys, UA PRESENT   hCG, quantitative, pregnancy     Status: Abnormal   Collection Time: 11/30/20  7:19 PM  Result Value Ref Range   hCG, Beta Chain, Quant, S 22,298 (H) <5 mIU/mL   A Post-coital vaginal spotting with known IUP Medical screening exam complete  P Discharge from MAU in stable condition with first trimester bleeding precautions Will call with results of bHCG Discussed possible offices for pt to begin routine OB care (pt will have pregnancy Medicaid). Plans to call [facility]  01/30/21, CNM 11/30/2020 7:23 PM

## 2021-01-29 ENCOUNTER — Other Ambulatory Visit: Payer: Self-pay

## 2021-01-29 ENCOUNTER — Encounter (HOSPITAL_COMMUNITY): Payer: Self-pay | Admitting: Family Medicine

## 2021-01-29 ENCOUNTER — Inpatient Hospital Stay (HOSPITAL_COMMUNITY)
Admission: AD | Admit: 2021-01-29 | Discharge: 2021-01-29 | Disposition: A | Discharge status: Home / Self Care | Payer: Medicaid Other | Attending: Family Medicine | Admitting: Family Medicine

## 2021-01-29 DIAGNOSIS — Z79899 Other long term (current) drug therapy: Secondary | ICD-10-CM | POA: Insufficient documentation

## 2021-01-29 DIAGNOSIS — Z3A14 14 weeks gestation of pregnancy: Secondary | ICD-10-CM | POA: Insufficient documentation

## 2021-01-29 DIAGNOSIS — O99352 Diseases of the nervous system complicating pregnancy, second trimester: Secondary | ICD-10-CM | POA: Insufficient documentation

## 2021-01-29 DIAGNOSIS — R519 Headache, unspecified: Secondary | ICD-10-CM | POA: Diagnosis present

## 2021-01-29 DIAGNOSIS — G44209 Tension-type headache, unspecified, not intractable: Secondary | ICD-10-CM | POA: Insufficient documentation

## 2021-01-29 LAB — URINALYSIS, ROUTINE W REFLEX MICROSCOPIC
Bilirubin Urine: NEGATIVE
Glucose, UA: NEGATIVE mg/dL
Hgb urine dipstick: NEGATIVE
Ketones, ur: NEGATIVE mg/dL
Leukocytes,Ua: NEGATIVE
Nitrite: NEGATIVE
Protein, ur: NEGATIVE mg/dL
Specific Gravity, Urine: 1.026 (ref 1.005–1.030)
pH: 5 (ref 5.0–8.0)

## 2021-01-29 MED ORDER — METOCLOPRAMIDE HCL 10 MG PO TABS: 10.0000 mg | ORAL_TABLET | Freq: Four times a day (QID) | ORAL | 2 refills | Status: DC | PRN

## 2021-01-29 MED ORDER — METOCLOPRAMIDE HCL 5 MG/ML IJ SOLN
10.0000 mg | Freq: Once | INTRAMUSCULAR | Status: AC
  Administered 2021-01-29: 10 mg via INTRAVENOUS
  Filled 2021-01-29: qty 2

## 2021-01-29 MED ORDER — ACETAMINOPHEN 500 MG PO TABS: 1000.0000 mg | ORAL_TABLET | Freq: Three times a day (TID) | ORAL | 0 refills | Status: DC | PRN

## 2021-01-29 MED ORDER — DIPHENHYDRAMINE HCL 50 MG/ML IJ SOLN
25.0000 mg | Freq: Once | INTRAMUSCULAR | Status: AC
  Administered 2021-01-29: 25 mg via INTRAVENOUS
  Filled 2021-01-29: qty 1

## 2021-01-29 MED ORDER — ACETAMINOPHEN 500 MG PO TABS
1000.0000 mg | ORAL_TABLET | Freq: Once | ORAL | Status: AC
  Administered 2021-01-29: 1000 mg via ORAL
  Filled 2021-01-29: qty 2

## 2021-01-29 MED ORDER — LACTATED RINGERS IV BOLUS
1000.0000 mL | Freq: Once | INTRAVENOUS | Status: AC
  Administered 2021-01-29: 1000 mL via INTRAVENOUS

## 2021-01-29 MED ORDER — DEXAMETHASONE SODIUM PHOSPHATE 10 MG/ML IJ SOLN
10.0000 mg | Freq: Once | INTRAMUSCULAR | Status: AC
  Administered 2021-01-29: 10 mg via INTRAVENOUS
  Filled 2021-01-29: qty 1

## 2021-01-29 NOTE — MAU Note (Signed)
Lorraine Burton is a 25 y.o. at [redacted]w[redacted]d here in MAU reporting: a headache since Saturday. Was taking 1 regular tylenol per day with no relief. No other pain, bleeding, or discharge.  Onset of complaint: ongoing  Pain score: 7/10  Vitals:   01/29/21 1444  BP: 109/64  Pulse: 91  Resp: 16  Temp: 98.7 F (37.1 C)  SpO2: 98%     Lab orders placed from triage: UA

## 2021-01-29 NOTE — MAU Provider Note (Signed)
History     CSN: [id number]  Arrival date and time: 01/29/21 1409   Event Date/Time   First Provider Initiated Contact with Patient 01/29/21 1504      Chief Complaint  Patient presents with  . Headache   HPI  Patient is a G3P1011 at [redacted]w[redacted]d who presents for headaches. It started three days ago and is constant, dull, and bilateral. No associated nausea, vomiting, fever. Eyes are watering a little bit. She is tolerating PO. Rates pain as 7/10. She has tried tylenol. Reports taking one single pill of tylenol a day. Pregnancy is going well otherwise. No vision changes, no neck pain. Reports she gets headaches occasionally but that they typically go away without issue.   Taking a PNV, no other medication. No other past medical history.   Works 3 days per week. Denies drug use. No alcohol use.    OB History    Gravida  3   Para  1   Term  1   Preterm      AB  1   Living  1     SAB  1   IAB      Ectopic      Multiple      Live Births  1           Past Medical History:  Diagnosis Date  . Medical history non-contributory     Past Surgical History:  Procedure Laterality Date  . NO PAST SURGERIES      Family History  Problem Relation Age of Onset  . Hypertension Mother     Social History   Tobacco Use  . Smoking status: Never Smoker  . Smokeless tobacco: Never Used  Vaping Use  . Vaping Use: Never used  Substance Use Topics  . Alcohol use: No  . Drug use: No    Allergies: No Known Allergies  Medications Prior to Admission  Medication Sig Dispense Refill Last Dose  . acetaminophen (TYLENOL) 325 MG tablet Take 325 mg by mouth every 6 (six) hours as needed.   01/28/2021 at 1600  . Prenatal Vit-Fe Fumarate-FA (PRENATAL MULTIVITAMIN) TABS tablet Take 1 tablet by mouth daily at 12 noon.   01/28/2021 at Unknown time  . cetirizine (ZYRTEC ALLERGY) 10 MG tablet Take 1 tablet (10 mg total) by mouth daily. 30 tablet 0   . fluticasone (FLONASE) 50 MCG/ACT  nasal spray Place 2 sprays into both nostrils daily. 16 g 2     Review of Systems  Constitutional: Negative for fatigue and fever.  Eyes: Positive for photophobia. Negative for pain and visual disturbance.  Cardiovascular: Negative for chest pain.  Musculoskeletal: Negative for back pain, myalgias, neck pain and neck stiffness.  Neurological: Negative for dizziness, tremors, facial asymmetry, speech difficulty, light-headedness and numbness.  Psychiatric/Behavioral: Negative for confusion.   Physical Exam   Blood pressure 113/65, pulse 87, temperature 98.7 F (37.1 C), temperature source Oral, resp. rate 16, height 5\' 8"  (1.727 m), weight 64.6 kg, last menstrual period 10/21/2020, SpO2 98 %, unknown if currently breastfeeding.  Physical Exam Vitals and nursing note reviewed.  Constitutional:      Appearance: She is well-developed.  HENT:     Head: Normocephalic and atraumatic.  Eyes:     Extraocular Movements: Extraocular movements intact.     Pupils: Pupils are equal, round, and reactive to light.  Cardiovascular:     Rate and Rhythm: Normal rate and regular rhythm.  Pulmonary:     Effort: Pulmonary effort  is normal.  Musculoskeletal:     Cervical back: Normal range of motion. No rigidity.  Skin:    General: Skin is warm and dry.  Neurological:     Mental Status: She is alert and oriented to person, place, and time.     Cranial Nerves: No cranial nerve deficit.     Motor: No weakness.  Psychiatric:        Mood and Affect: Mood normal.        Speech: Speech normal.        Behavior: Behavior normal.     MAU Course  Procedures  MDM Pt evaluated at bedside Normal neurologic exam  Pt given 1 L LR, Decadron 10, Reglan 10, Benadryl 25, Tylenol 1g  On reassessment, patient is feeling improved Stable for dc home  Assessment and Plan   Headache -stable for discharge home -provided script for tylenol, reglan -instructed on return precautions   Gita Kudo 01/29/2021, 3:15 PM

## 2021-02-21 ENCOUNTER — Other Ambulatory Visit: Payer: Self-pay

## 2021-02-21 ENCOUNTER — Encounter: Payer: Self-pay | Admitting: Obstetrics

## 2021-02-21 ENCOUNTER — Ambulatory Visit (INDEPENDENT_AMBULATORY_CARE_PROVIDER_SITE_OTHER): Payer: Medicaid Other | Admitting: Obstetrics

## 2021-02-21 ENCOUNTER — Other Ambulatory Visit (HOSPITAL_COMMUNITY)
Admission: RE | Admit: 2021-02-21 | Discharge: 2021-02-21 | Disposition: A | Discharge status: Home / Self Care | Payer: Medicaid Other | Source: Ambulatory Visit | Attending: Obstetrics | Admitting: Obstetrics

## 2021-02-21 VITALS — BP 110/74 | HR 80 | Wt 145.0 lb

## 2021-02-21 DIAGNOSIS — A599 Trichomoniasis, unspecified: Secondary | ICD-10-CM

## 2021-02-21 DIAGNOSIS — Z3A17 17 weeks gestation of pregnancy: Secondary | ICD-10-CM

## 2021-02-21 DIAGNOSIS — Z3480 Encounter for supervision of other normal pregnancy, unspecified trimester: Secondary | ICD-10-CM | POA: Insufficient documentation

## 2021-02-21 DIAGNOSIS — G44209 Tension-type headache, unspecified, not intractable: Secondary | ICD-10-CM

## 2021-02-21 MED ORDER — GOJJI WEIGHT SCALE MISC: 1.0000 | 0 refills | Status: DC | PRN

## 2021-02-21 MED ORDER — BLOOD PRESSURE KIT DEVI
1.0000 | 0 refills | Status: DC | PRN
Start: 2021-02-21 — End: 2021-07-23

## 2021-02-21 NOTE — Addendum Note (Signed)
Addended by: Kennon Portela on: 02/21/2021 02:21 PM   Modules accepted: Orders

## 2021-02-21 NOTE — Progress Notes (Signed)
Subjective:    Lorraine Burton is being seen today for her first obstetrical visit.  This is a planned pregnancy. She is at 81w4dgestation. Her obstetrical history is significant for none. Relationship with FOB: significant other, not living together. Patient does intend to breast feed. Pregnancy history fully reviewed.  The information documented in the HPI was reviewed and verified.  Menstrual History: OB History    Gravida  3   Para  1   Term  1   Preterm      AB  1   Living  1     SAB  1   IAB      Ectopic      Multiple      Live Births  1            Patient's last menstrual period was 10/21/2020.    Past Medical History:  Diagnosis Date  . Medical history non-contributory     Past Surgical History:  Procedure Laterality Date  . NO PAST SURGERIES      (Not in a hospital admission)  No Known Allergies  Social History   Tobacco Use  . Smoking status: Never Smoker  . Smokeless tobacco: Never Used  Substance Use Topics  . Alcohol use: No    Family History  Problem Relation Age of Onset  . Hypertension Mother      Review of Systems Constitutional: negative for weight loss Gastrointestinal: negative for vomiting Genitourinary:negative for genital lesions and vaginal discharge and dysuria Musculoskeletal:negative for back pain Behavioral/Psych: negative for abusive relationship, depression, illegal drug usage and tobacco use    Objective:    BP 110/74   Pulse 80   Wt 145 lb (65.8 kg)   LMP 10/21/2020   BMI 22.05 kg/m  General Appearance:    Alert, cooperative, no distress, appears stated age  Head:    Normocephalic, without obvious abnormality, atraumatic  Eyes:    PERRL, conjunctiva/corneas clear, EOM's intact, fundi    benign, both eyes  Ears:    Normal TM's and external ear canals, both ears  Nose:   Nares normal, septum midline, mucosa normal, no drainage    or sinus tenderness  Throat:   Lips, mucosa, and tongue normal; teeth  and gums normal  Neck:   Supple, symmetrical, trachea midline, no adenopathy;    thyroid:  no enlargement/tenderness/nodules; no carotid   bruit or JVD  Back:     Symmetric, no curvature, ROM normal, no CVA tenderness  Lungs:     Clear to auscultation bilaterally, respirations unlabored  Chest Wall:    No tenderness or deformity   Heart:    Regular rate and rhythm, S1 and S2 normal, no murmur, rub   or gallop  Breast Exam:    No tenderness, masses, or nipple abnormality  Abdomen:     Soft, non-tender, bowel sounds active all four quadrants,    no masses, no organomegaly  Genitalia:    Normal female without lesion, discharge or tenderness  Extremities:   Extremities normal, atraumatic, no cyanosis or edema  Pulses:   2+ and symmetric all extremities  Skin:   Skin color, texture, turgor normal, no rashes or lesions  Lymph nodes:   Cervical, supraclavicular, and axillary nodes normal  Neurologic:   CNII-XII intact, normal strength, sensation and reflexes    throughout      Lab Review Urine pregnancy test Labs reviewed yes Radiologic studies reviewed no  Assessment:     1. Supervision  of other normal pregnancy, antepartum Rx: - Cytology - PAP( [place]) - Cervicovaginal ancillary only - Culture, OB Urine - CBC/D/Plt+RPR+Rh+ABO+Rub Ab... - Genetic Screening - AFP, Serum, Open Spina Bifida - Blood Pressure Monitoring (BLOOD PRESSURE KIT) DEVI; 1 Device by Does not apply route as needed.  Dispense: 1 each; Refill: 0 - Misc. Devices (GOJJI WEIGHT SCALE) MISC; 1 Device by Does not apply route as needed.  Dispense: 1 each; Refill: 0 - Enroll Patient in PreNatal 53 - [place] MFM OB DETAIL +14 WK; Future  2. Acute non intractable tension-type headache - clinically stable  3. Trichomonas infection, treated - needs TOC    Plan:      Prenatal vitamins.  Counseling provided regarding continued use of seat belts, cessation of alcohol consumption, smoking or use of illicit  drugs; infection precautions i.e., influenza/TDAP immunizations, toxoplasmosis,CMV, parvovirus, listeria and varicella; workplace safety, exercise during pregnancy; routine dental care, safe medications, sexual activity, hot tubs, saunas, pools, travel, caffeine use, fish and methlymercury, potential toxins, hair treatments, varicose veins Weight gain recommendations per IOM guidelines reviewed: underweight/BMI< 18.5--> gain 28 - 40 lbs; normal weight/BMI 18.5 - 24.9--> gain 25 - 35 lbs; overweight/BMI 25 - 29.9--> gain 15 - 25 lbs; obese/BMI >30->gain  11 - 20 lbs Problem list reviewed and updated. FIRST/CF mutation testing/NIPT/QUAD SCREEN/fragile X/Ashkenazi Jewish population testing/Spinal muscular atrophy discussed: requested. Role of ultrasound in pregnancy discussed; fetal survey: requested. Amniocentesis discussed: not indicated.  Meds ordered this encounter  Medications  . Blood Pressure Monitoring (BLOOD PRESSURE KIT) DEVI    Sig: 1 Device by Does not apply route as needed.    Dispense:  1 each    Refill:  0  . Misc. Devices (GOJJI WEIGHT SCALE) MISC    Sig: 1 Device by Does not apply route as needed.    Dispense:  1 each    Refill:  0   Orders Placed This Encounter  Procedures  . Culture, OB Urine  . [place] MFM OB DETAIL +14 WK    Standing Status:   Future    Standing Expiration Date:   02/21/2022    Order Specific Question:   Reason for Exam (SYMPTOM  OR DIAGNOSIS REQUIRED)    Answer:   Anatomy    Order Specific Question:   Preferred Location    Answer:   [facility] Ultrasound  . CBC/D/Plt+RPR+Rh+ABO+Rub Ab...  . Genetic Screening    [facility]  . AFP, Serum, Open Spina Bifida    Order Specific Question:   Is patient insulin dependent?    Answer:   No    Order Specific Question:   Patient weight (lb.)    Answer:   145lb    Order Specific Question:   Gestational Age (GA), weeks    Answer:   17.4    Order Specific Question:   Date on which patient was at this [facility]    Answer:    02/21/2021    Order Specific Question:   GA Calculation Method    Answer:   LMP    Order Specific Question:   Number of fetuses    Answer:   1    Order Specific Question:   Reason for screen    Answer:   OTHER    Comments:   routine     Order Specific Question:   Donor egg?    Answer:   N    Order Specific Question:   Age of egg donor?    Answer:   0  Follow up in 4 weeks.  I have spent a total of 20 minutes of face-to-face time, excluding clinical staff time, reviewing notes and preparing to see patient, ordering tests and/or medications, and counseling the patient.  Shelly Bombard, MD 02/21/2021 12:22 PM

## 2021-02-21 NOTE — Progress Notes (Signed)
Pt presents for NOB This is a planned pregnancy FOB is not involved.  Pt unsure last pap smear.  GAD7= 2 PHQ9= 1

## 2021-02-21 NOTE — Addendum Note (Signed)
Addended by: Dalphine Handing on: 02/21/2021 02:44 PM   Modules accepted: Orders

## 2021-02-23 LAB — OBSTETRIC PANEL, INCLUDING HIV
Antibody Screen: NEGATIVE
Basophils Absolute: 0 10*3/uL (ref 0.0–0.2)
Basos: 0 %
EOS (ABSOLUTE): 0 10*3/uL (ref 0.0–0.4)
Eos: 0 %
HIV Screen 4th Generation wRfx: NONREACTIVE
Hematocrit: 37 % (ref 34.0–46.6)
Hemoglobin: 12.2 g/dL (ref 11.1–15.9)
Hepatitis B Surface Ag: NEGATIVE
Immature Grans (Abs): 0 10*3/uL (ref 0.0–0.1)
Immature Granulocytes: 0 %
Lymphocytes Absolute: 1.2 10*3/uL (ref 0.7–3.1)
Lymphs: 13 %
MCH: 33.3 pg — ABNORMAL HIGH (ref 26.6–33.0)
MCHC: 33 g/dL (ref 31.5–35.7)
MCV: 101 fL — ABNORMAL HIGH (ref 79–97)
Monocytes Absolute: 0.8 10*3/uL (ref 0.1–0.9)
Monocytes: 9 %
Neutrophils Absolute: 7.2 10*3/uL — ABNORMAL HIGH (ref 1.4–7.0)
Neutrophils: 78 %
Platelets: 208 10*3/uL (ref 150–450)
RBC: 3.66 x10E6/uL — ABNORMAL LOW (ref 3.77–5.28)
RDW: 10.6 % — ABNORMAL LOW (ref 11.7–15.4)
RPR Ser Ql: NONREACTIVE
Rh Factor: POSITIVE
Rubella Antibodies, IGG: 9.57 index (ref >0.99)
WBC: 9.2 10*3/uL (ref 3.4–10.8)

## 2021-02-23 LAB — CERVICOVAGINAL ANCILLARY ONLY
Bacterial Vaginitis (gardnerella): POSITIVE — AB
Candida Glabrata: NEGATIVE
Candida Vaginitis: NEGATIVE
Chlamydia: NEGATIVE
Comment: NEGATIVE
Comment: NEGATIVE
Comment: NEGATIVE
Comment: NEGATIVE
Comment: NEGATIVE
Comment: NORMAL
Neisseria Gonorrhea: NEGATIVE
Trichomonas: NEGATIVE

## 2021-02-23 LAB — AFP, SERUM, OPEN SPINA BIFIDA
AFP MoM: 0.83
AFP Value: 39.1 ng/mL
Gest. Age on Collection Date: 17.4 weeks
Maternal Age At EDD: 26.9 yr
OSBR Risk 1 IN: 10000
Test Results:: NEGATIVE
Weight: 145 [lb_av]

## 2021-02-23 LAB — URINE CULTURE, OB REFLEX

## 2021-02-23 LAB — HEPATITIS C ANTIBODY: Hep C Virus Ab: 0.1 s/co ratio (ref 0.0–0.9)

## 2021-02-23 LAB — CYTOLOGY - PAP: Diagnosis: NEGATIVE

## 2021-02-23 LAB — CULTURE, OB URINE

## 2021-02-24 ENCOUNTER — Other Ambulatory Visit: Payer: Self-pay | Admitting: Obstetrics

## 2021-02-24 DIAGNOSIS — B9689 Other specified bacterial agents as the cause of diseases classified elsewhere: Secondary | ICD-10-CM

## 2021-02-24 DIAGNOSIS — N760 Acute vaginitis: Secondary | ICD-10-CM

## 2021-02-24 MED ORDER — METRONIDAZOLE 500 MG PO TABS: 500.0000 mg | ORAL_TABLET | Freq: Two times a day (BID) | ORAL | 2 refills | Status: DC

## 2021-02-27 ENCOUNTER — Encounter: Payer: Self-pay | Admitting: Obstetrics

## 2021-02-27 NOTE — Addendum Note (Signed)
Addended by: Cheree Ditto, Selvin Yun A on: 02/27/2021 01:45 PM   Modules accepted: Orders

## 2021-03-05 ENCOUNTER — Encounter: Payer: Self-pay | Admitting: Obstetrics

## 2021-03-19 ENCOUNTER — Telehealth: Payer: Self-pay

## 2021-03-19 ENCOUNTER — Ambulatory Visit: Payer: Medicaid Other

## 2021-03-19 NOTE — Telephone Encounter (Signed)
mar/sw patient-advised of updated appt information for 03/22/21-arrive@1245p .

## 2021-03-21 ENCOUNTER — Other Ambulatory Visit: Payer: Self-pay

## 2021-03-21 ENCOUNTER — Ambulatory Visit (INDEPENDENT_AMBULATORY_CARE_PROVIDER_SITE_OTHER): Payer: Medicaid Other | Admitting: Women's Health

## 2021-03-21 VITALS — BP 100/64 | HR 69 | Wt 155.0 lb

## 2021-03-21 DIAGNOSIS — Z3A21 21 weeks gestation of pregnancy: Secondary | ICD-10-CM

## 2021-03-21 DIAGNOSIS — Z3480 Encounter for supervision of other normal pregnancy, unspecified trimester: Secondary | ICD-10-CM

## 2021-03-21 DIAGNOSIS — Z148 Genetic carrier of other disease: Secondary | ICD-10-CM | POA: Insufficient documentation

## 2021-03-21 NOTE — Patient Instructions (Signed)
Maternity Assessment Unit (MAU)  The Maternity Assessment Unit (MAU) is located at the [facility] at [facility]. The address is: [address] [place], [place], [place] [address]. Please see map below for additional directions.    The Maternity Assessment Unit is designed to help you during your pregnancy, and for up to 6 weeks after delivery, with any pregnancy- or postpartum-related emergencies, if you think you are in labor, or if your water has broken. For example, if you experience nausea and vomiting, vaginal bleeding, severe abdominal or pelvic pain, elevated blood pressure or other problems related to your pregnancy or postpartum time, please come to the Maternity Assessment Unit for assistance.       Contraception Choices - [email or web address] Contraception, also called birth control, refers to methods or devices thatprevent pregnancy. Hormonal methods  Contraceptive implant A contraceptive implant is a thin, plastic tube that contains a hormone that prevents pregnancy. It is different from an intrauterine device (IUD). It is inserted into the upper part of the arm by a health care provider. Implants canbe effective for up to 3 years. Progestin-only injections Progestin-only injections are injections of progestin, a synthetic form of thehormone progesterone. They are given every 3 months by a health care provider. Birth control pills Birth control pills are pills that contain hormones that prevent pregnancy. They must be taken once a day, preferably at the same time each day. Aprescription is needed to use this method of contraception. Birth control patch The birth control patch contains hormones that prevent pregnancy. It is placed on the skin and must be changed once a week for three weeks and removed on thefourth week. A prescription is needed to use this method of contraception. Vaginal ring A vaginal ring contains hormones that prevent  pregnancy. It is placed in the vagina for three weeks and removed on the fourth week. After that, the process is repeated with a new ring. A prescription is needed to use this method ofcontraception. Emergency contraceptive Emergency contraceptives prevent pregnancy after unprotected sex. They come in pill form and can be taken up to 5 days after sex. They work best the sooner they are taken after having sex. Most emergency contraceptives are available without a prescription. This method should not be used as your only form ofbirth control. Barrier methods  Female condom A female condom is a thin sheath that is worn over the penis during sex. Condoms keep sperm from going inside a woman's body. They can be used with a sperm-killing substance (spermicide) to increase their effectiveness. They should be thrown away after one use. Female condom A female condom is a soft, loose-fitting sheath that is put into the vagina before sex. The condom keeps sperm from going inside a woman's body. Theyshould be thrown away after one use. Diaphragm A diaphragm is a soft, dome-shaped barrier. It is inserted into the vagina before sex, along with a spermicide. The diaphragm blocks sperm from entering the uterus, and the spermicide kills sperm. A diaphragm should be left in thevagina for 6-8 hours after sex and removed within 24 hours. A diaphragm is prescribed and fitted by a health care provider. A diaphragm should be replaced every 1-2 years, after giving birth, after gaining more than15 lb (6.8 kg), and after pelvic surgery. Cervical cap A cervical cap is a round, soft latex or plastic cup that fits over the cervix. It is inserted into the vagina before sex, along with spermicide. It blocks sperm from entering  the uterus. The cap should be left in place for 6-8 hours after sex and removed within 48 hours. A cervical cap must be prescribed andfitted by a health care provider. It should be replaced every 2  years. Sponge A sponge is a soft, circular piece of polyurethane foam with spermicide in it. The sponge helps block sperm from entering the uterus, and the spermicide kills sperm. To use it, you make it wet and then insert it into the vagina. It should be inserted before sex, left in for at least 6 hours after sex, and removed andthrown away within 30 hours. Spermicides Spermicides are chemicals that kill or block sperm from entering the cervix and uterus. They can come as a cream, jelly, suppository, foam, or tablet. A spermicide should be inserted into the vagina with an applicator at least 10-15 minutes before sex to allow time for it to work. The process must be repeatedevery time you have sex. Spermicides do not require a prescription. Intrauterine contraception Intrauterine device (IUD) An IUD is a T-shaped device that is put in a woman's uterus. There are two types: Hormone IUD.This type contains progestin, a synthetic form of the hormone progesterone. This type can stay in place for 3-5 years. Copper IUD.This type is wrapped in copper wire. It can stay in place for 10 years. Permanent methods of contraception Female tubal ligation In this method, a woman's fallopian tubes are sealed, tied, or blocked duringsurgery to prevent eggs from traveling to the uterus. Hysteroscopic sterilization In this method, a small, flexible insert is placed into each fallopian tube. The inserts cause scar tissue to form in the fallopian tubes and block them, so sperm cannot reach an egg. The procedure takes about 3 months to be effective.Another form of birth control must be used during those 3 months. Female sterilization This is a procedure to tie off the tubes that carry sperm (vasectomy). After the procedure, the man can still ejaculate fluid (semen). Another form of birth control must be used for 3 months after the procedure. Natural planning methods Natural family planning In this method, a couple does not  have sex on days when the woman could become pregnant. Calendar method In this method, the woman keeps track of the length of each menstrual cycle, identifies the days when pregnancy can happen, and does not have sex on those days. Ovulation method In this method, a couple avoids sex during ovulation. Symptothermal method This method involves not having sex during ovulation. The woman typically checks for ovulation bywatching changes in her temperature and in the consistency of cervical mucus. Post-ovulation method In this method, a couple waits to have sex until after ovulation. Where to find more information Centers for Disease Control and Prevention: FootballExhibition.com.br Summary Contraception, also called birth control, refers to methods or devices that prevent pregnancy. Hormonal methods of contraception include implants, injections, pills, patches, vaginal rings, and emergency contraceptives. Barrier methods of contraception can include female condoms, female condoms, diaphragms, cervical caps, sponges, and spermicides. There are two types of IUDs (intrauterine devices). An IUD can be put in a woman's uterus to prevent pregnancy for 3-5 years. Permanent sterilization can be done through a procedure for males and females. Natural family planning methods involve nothaving sex on days when the woman could become pregnant. This information is not intended to replace advice given to you by your health care provider. Make sure you discuss any questions you have with your healthcare provider. Document Revised: 02/14/2020 Document Reviewed: 02/14/2020 Elsevier Patient  Education  2022 Elsevier Inc.       AREA PEDIATRIC/FAMILY PRACTICE PHYSICIANS  [facility] [address] [place], [place] [address] Phone - [phone]   Fax - [phone]  JACK AMOS [address] [place], [place]  [address] Phone - [phone]   Fax - [phone]  [facility] [address] [place], [place]  [address] Phone - [phone]   Fax - [phone]  [facility] [address] [place], [place]  [address] Phone - [phone]   Fax - [phone]  [facility] [address] [place], [place]  [address] Phone - [phone]   Fax - [phone]  [facility] [address] [place], [place]  [address] Phone - [phone]   Fax - [phone]  [facility] [place] [facility] [address] [place], [place]  [address] Phone - [phone]   Fax - [phone]  [facility] AT [facility] [address] [place], [address] [place], [place]  [address] Phone - [phone]   Fax - [phone]  [facility] AT [facility] [address] [place], [place]  [address] Phone - [phone]   Fax - [phone] [facility] [place] [address] [place], [place]  [address] Phone - [phone]   Fax - [phone]  [facility] [address] [place], [place]  [address] Phone - [phone]   Fax - [phone]  [facility] [address], [address] [place], [place]  [address] Phone - [phone]   Fax - [phone]  [facility] [address] [place], [place]  [address] Phone - [phone]   Fax - [phone]  [facility] [address] [place], [place]  [address] Phone - [phone]  [facility] [address] [place], [place]  [address] Phone - [phone]   Fax - [phone]  [facility] [address] [place], [place]  [address] Phone - [phone]   Fax - [phone]  [facility] [address] [place], [place]  [address] Phone - [phone]   Fax - [phone]  [facility] [address] [place], [place]  [address] Phone - [phone]   Fax  - [phone]   [facility] [address] [place], [place]  [address] Phone - [phone]   Fax - [phone]  [facility] [address] [place], [place]  [address] Phone - [phone]   Fax - [phone]  DAVID RUBIN [address] [place], [place]  [address] Phone - [phone]   Fax - [phone]  [facility] [address] [place], [place]  [address] Phone - [phone]   Fax - [phone]  [facility] [address] [place], [place]  [address] Phone - [phone]   Fax - [phone] Gerarda Fraction [address] [place], [place]  [address] Phone - [phone]   Fax - [phone]  [facility] [address] [place], [place]  [address] Phone - [phone]   Fax - [phone]  [facility] [place] [address] [place], [place]  [address] Phone - [phone]   Fax - [phone]        Preterm Labor The normal length of a pregnancy is 39-41 weeks. Preterm labor is when labor  starts before 37 completed weeks of pregnancy. Babies who are born prematurely and survive may not be fully developed and may be at an increased risk for long-term problems such as cerebral palsy, developmental delays, and vision andhearing problems. Babies who are born too early may have problems soon after birth. Premature babies may have problems regulating blood sugar, body temperature, heart rate, and breathing rate. These babies often have trouble with feeding. The risk ofhaving problems is highest for babies who are born before 34 weeks of pregnancy. What are the causes? The exact cause of this condition is not known. What increases the risk? You are more likely to have preterm labor if you have certain risk factors that relate to your medical history, problems with present and past pregnancies, andlifestyle  factors. Medical history You have abnormalities of the uterus, including a short cervix. You have STIs (sexually transmitted infections) or other infections of the urinary tract and the vagina. You have chronic illnesses, such as blood clotting problems, diabetes, or high blood pressure. You are overweight or underweight. Present and past pregnancies You have had preterm labor before. You are pregnant with twins or other multiples. You have been diagnosed with a condition in which the placenta covers your cervix (placenta previa). You waited less than 18 months between giving birth and becoming pregnant again. Your unborn baby has some abnormalities. You have vaginal bleeding during pregnancy. You became pregnant through in vitro fertilization (IVF). Lifestyle and environmental factors You use tobacco products or drink alcohol. You use drugs. You have stress and no social support. You experience domestic violence. You are exposed to certain chemicals or environmental pollutants. Other factors You are younger than age 35 or older than age 86. What are the signs or symptoms? Symptoms of this condition include: Cramps similar to those that can happen during a menstrual period. The cramps may happen with diarrhea. Pain in the abdomen or lower back. Regular contractions that may feel like tightening of the abdomen. A feeling of increased pressure in the pelvis. Increased watery or bloody mucus discharge from the vagina. Water breaking (ruptured amniotic sac). How is this diagnosed? This condition is diagnosed based on: Your medical history and a physical exam. A pelvic exam. An ultrasound. Monitoring your uterus for contractions. Other tests, including: A swab of the cervix to check for a chemical called fetal fibronectin. Urine tests. How is this treated? Treatment for this condition depends on the length of your pregnancy, your condition, and the health of your baby. Treatment  may include: Taking medicines, such as: Hormone medicines. These may be given early in pregnancy to help support the pregnancy. Medicines to stop contractions. Medicines to help mature the baby's lungs. These may be prescribed if the risk of delivery is high. Medicines to help protect your baby from brain and nerve complications such as cerebral palsy. Bed rest. If the labor happens before 34 weeks of pregnancy, you may need to stay in the hospital. Delivery of the baby. Follow these instructions at home:  Do not use any products that contain nicotine or tobacco. These products include cigarettes, chewing tobacco, and vaping devices, such as e-cigarettes. If you need help quitting, ask your health care provider. Do not drink alcohol. Take over-the-counter and prescription medicines only as told by your health care provider. Rest as told by your health care provider. Return to your normal activities as told by your health care provider. Ask your health care provider what activities are safe for you. Keep  all follow-up visits. This is important. How is this prevented? To increase your chance of having a full-term pregnancy: Do not use drugs or take medicines that have not been prescribed to you during your pregnancy. Talk with your health care provider before taking any herbal supplements, even if you have been taking them regularly. Make sure you gain a healthy amount of weight during your pregnancy. Watch for infection. If you think that you might have an infection, get it checked right away. Symptoms of infection may include: Fever. Abnormal vaginal discharge or discharge that smells bad. Pain or burning with urination. Needing to urinate urgently. Frequently urinating or passing small amounts of urine frequently. Blood in your urine or urine that smells bad or unusual. Where to find more information U.S. Department of Health and Cytogeneticist on Women's Health:  http://hoffman.com/ [employer] of Obstetricians and Gynecologists: www.acog.org Centers for Disease Control and Prevention, Preterm Birth: FootballExhibition.com.br Contact a health care provider if: You think you are going into preterm labor. You have signs or symptoms of preterm labor. You have symptoms of infection. Get help right away if: You are having regular, painful contractions every 5 minutes or less. Your water breaks. Summary Preterm labor is labor that starts before you reach 37 weeks of pregnancy. Delivering your baby early increases your baby's risk of developing long-term problems. You are more likely to have preterm labor if you have certain risk factors that relate to your medical history, problems with present and past pregnancies, and lifestyle factors. Keep all follow-up visits. This is important. Contact a health care provider if you have signs or symptoms of preterm labor. This information is not intended to replace advice given to you by your health care provider. Make sure you discuss any questions you have with your healthcare provider. Document Revised: 09/12/2020 Document Reviewed: 09/12/2020 Elsevier Patient Education  2022 Elsevier Inc.       Round Ligament Pain  The round ligament is a cord of muscle and tissue that helps support the uterus. It can become a source of pain during pregnancy if it becomes stretched or twisted as the baby grows. The pain usually begins in the second trimester (13-28 weeks) of pregnancy, and it can come and go until the baby is delivered.It is not a serious problem, and it does not cause harm to the baby. Round ligament pain is usually a short, sharp, and pinching pain, but it can also be a dull, lingering, and aching pain. The pain is felt in the lower side of the abdomen or in the groin. It usually starts deep in the groin and moves up to the outside of the hip area. The pain may occur when you: Suddenly change position, such as  quickly going from a sitting to standing position. Roll over in bed. Cough or sneeze. Do physical activity. Follow these instructions at home:  Watch your condition for any changes. When the pain starts, relax. Then try any of these methods to help with the pain: Sitting down. Flexing your knees up to your abdomen. Lying on your side with one pillow under your abdomen and another pillow between your legs. Sitting in a warm bath for 15-20 minutes or until the pain goes away. Take over-the-counter and prescription medicines only as told by your health care provider. Move slowly when you sit down or stand up. Avoid long walks if they cause pain. Stop or reduce your physical activities if they cause pain. Keep all follow-up visits  as told by your health care provider. This is important. Contact a health care provider if: Your pain does not go away with treatment. You feel pain in your back that you did not have before. Your medicine is not helping. Get help right away if: You have a fever or chills. You develop uterine contractions. You have vaginal bleeding. You have nausea or vomiting. You have diarrhea. You have pain when you urinate. Summary Round ligament pain is felt in the lower abdomen or groin. It is usually a short, sharp, and pinching pain. It can also be a dull, lingering, and aching pain. This pain usually begins in the second trimester (13-28 weeks). It occurs because the uterus is stretching with the growing baby, and it is not harmful to the baby. You may notice the pain when you suddenly change position, when you cough or sneeze, or during physical activity. Relaxing, flexing your knees to your abdomen, lying on one side, or taking a warm bath may help to get rid of the pain. Get help from your health care provider if the pain does not go away or if you have vaginal bleeding, nausea, vomiting, diarrhea, or painful urination. This information is not intended to replace  advice given to you by your health care provider. Make sure you discuss any questions you have with your healthcare provider. Document Revised: 08/25/2020 Document Reviewed: 02/25/2018 Elsevier Patient Education  2022 [employer].       Second Trimester of Pregnancy  The second trimester of pregnancy is from week 13 through week 27. This is months 4 through 6 of pregnancy. The second trimester is often a time when you feel your best. Your body has adjusted to being pregnant, and you begin to feelbetter physically. During the second trimester: Morning sickness has lessened or stopped completely. You may have more energy. You may have an increase in appetite. The second trimester is also a time when the unborn baby (fetus) is growing rapidly. At the end of the sixth month, the fetus may be up to 12 inches long and weigh about 1 pounds. You will likely begin to feel the baby move (quickening) between 16 and 20 weeks of pregnancy. Body changes during your second trimester Your body continues to go through many changes during your second trimester.The changes vary and generally return to normal after the baby is born. Physical changes Your weight will continue to increase. You will notice your lower abdomen bulging out. You may begin to get stretch marks on your hips, abdomen, and breasts. Your breasts will continue to grow and to become tender. Dark spots or blotches (chloasma or mask of pregnancy) may develop on your face. A dark line from your belly button to the pubic area (linea nigra) may appear. You may have changes in your hair. These can include thickening of your hair, rapid growth, and changes in texture. Some people also have hair loss during or after pregnancy, or hair that feels dry or thin. Health changes You may develop headaches. You may have heartburn. You may develop constipation. You may develop hemorrhoids or swollen, bulging veins (varicose veins). Your gums may  bleed and may be sensitive to brushing and flossing. You may urinate more often because the fetus is pressing on your bladder. You may have back pain. This is caused by: Weight gain. Pregnancy hormones that are relaxing the joints in your pelvis. A shift in weight and the muscles that support your balance. Follow these instructions at home: Medicines  Follow your health care provider's instructions regarding medicine use. Specific medicines may be either safe or unsafe to take during pregnancy. Do not take any medicines unless approved by your health care provider. Take a prenatal vitamin that contains at least 600 micrograms (mcg) of folic acid. Eating and drinking Eat a healthy diet that includes fresh fruits and vegetables, whole grains, good sources of protein such as meat, eggs, or tofu, and low-fat dairy products. Avoid raw meat and unpasteurized juice, milk, and cheese. These carry germs that can harm you and your baby. You may need to take these actions to prevent or treat constipation: Drink enough fluid to keep your urine pale yellow. Eat foods that are high in fiber, such as beans, whole grains, and fresh fruits and vegetables. Limit foods that are high in fat and processed sugars, such as fried or sweet foods. Activity Exercise only as directed by your health care provider. Most people can continue their usual exercise routine during pregnancy. Try to exercise for 30 minutes at least 5 days a week. Stop exercising if you develop contractions in your uterus. Stop exercising if you develop pain or cramping in the lower abdomen or lower back. Avoid exercising if it is very hot or humid or if you are at a high altitude. Avoid heavy lifting. If you choose to, you may have sex unless your health care provider tells you not to. Relieving pain and discomfort Wear a supportive bra to prevent discomfort from breast tenderness. Take warm sitz baths to soothe any pain or discomfort caused by  hemorrhoids. Use hemorrhoid cream if your health care provider approves. Rest with your legs raised (elevated) if you have leg cramps or low back pain. If you develop varicose veins: Wear support hose as told by your health care provider. Elevate your feet for 15 minutes, 3-4 times a day. Limit salt in your diet. Safety Wear your seat belt at all times when driving or riding in a car. Talk with your health care provider if someone is verbally or physically abusive to you. Lifestyle Do not use hot tubs, steam rooms, or saunas. Do not douche. Do not use tampons or scented sanitary pads. Avoid cat litter boxes and soil used by cats. These carry germs that can cause birth defects in the baby and possibly loss of the fetus by miscarriage or stillbirth. Do not use herbal remedies, alcohol, illegal drugs, or medicines that are not approved by your health care provider. Chemicals in these products can harm your baby. Do not use any products that contain nicotine or tobacco, such as cigarettes, e-cigarettes, and chewing tobacco. If you need help quitting, ask your health care provider. General instructions During a routine prenatal visit, your health care provider will do a physical exam and other tests. He or she will also discuss your overall health. Keep all follow-up visits. This is important. Ask your health care provider for a referral to a local prenatal education class. Ask for help if you have counseling or nutritional needs during pregnancy. Your health care provider can offer advice or refer you to specialists for help with various needs. Where to find more information American Pregnancy Association: americanpregnancy.org [employer]: https://www.todd-brady.net/acog.org/en/Womens%20Health/Pregnancy Office on [employer]: MightyReward.co.nzwomenshealth.gov/pregnancy Contact a health care provider if you have: A headache that does not go away when you take medicine. Vision changes or you see  spots in front of your eyes. Mild pelvic cramps, pelvic pressure, or nagging pain in the abdominal  area. Persistent nausea, vomiting, or diarrhea. A bad-smelling vaginal discharge or foul-smelling urine. Pain when you urinate. Sudden or extreme swelling of your face, hands, ankles, feet, or legs. A fever. Get help right away if you: Have fluid leaking from your vagina. Have spotting or bleeding from your vagina. Have severe abdominal cramping or pain. Have difficulty breathing. Have chest pain. Have fainting spells. Have not felt your baby move for the time period told by your health care provider. Have new or increased pain, swelling, or redness in an arm or leg. Summary The second trimester of pregnancy is from week 13 through week 27 (months 4 through 6). Do not use herbal remedies, alcohol, illegal drugs, or medicines that are not approved by your health care provider. Chemicals in these products can harm your baby. Exercise only as directed by your health care provider. Most people can continue their usual exercise routine during pregnancy. Keep all follow-up visits. This is important. This information is not intended to replace advice given to you by your health care provider. Make sure you discuss any questions you have with your healthcare provider. Document Revised: 02/16/2020 Document Reviewed: 12/23/2019 Elsevier Patient Education  2022 [employer].

## 2021-03-21 NOTE — Progress Notes (Signed)
Subjective:  Lorraine Burton is a 25 y.o. G3P1011 at [redacted]w[redacted]d being seen today for ongoing prenatal care.  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  Patient reports no complaints.  Contractions: Not present. Vag. Bleeding: None.  Movement: Present. Denies leaking of fluid.   The following portions of the patient's history were reviewed and updated as appropriate: allergies, current medications, past family history, past medical history, past social history, past surgical history and problem list. Problem list updated.  Objective:   Vitals:   03/21/21 1323  BP: 100/64  Pulse: 69  Weight: 155 lb (70.3 kg)    Fetal Status: Fetal Heart Rate (bpm): 152   Movement: Present     General:  Alert, oriented and cooperative. Patient is in no acute distress.  Skin: Skin is warm and dry. No rash noted.   Cardiovascular: Normal heart rate noted  Respiratory: Normal respiratory effort, no problems with respiration noted  Abdomen: Soft, gravid, appropriate for gestational age. Pain/Pressure: Absent     Pelvic: Vag. Bleeding: None     Cervical exam deferred        Extremities: Normal range of motion.  Edema: Trace  Mental Status: Normal mood and affect. Normal behavior. Normal judgment and thought content.   Urinalysis:      Assessment and Plan:  Pregnancy: G3P1011 at [redacted]w[redacted]d  1. Supervision of other normal pregnancy, antepartum -discussed contraception, info given -peds list given  2. [redacted] weeks gestation of pregnancy  3. 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 I discussed the assessment and treatment plan with the patient. The patient  was provided an opportunity to ask questions and all were answered. The patient agreed with the plan and demonstrated an understanding of the instructions. The patient was advised to call back or seek an in-person office evaluation/go to [facility]U at [facility] for any urgent or concerning symptoms. Please refer to After Visit Summary for other counseling recommendations.  Return in about 4 weeks (around 04/18/2021) for in-person LOB/APP OK, needs genetics appt.   Jc Veron, Odie Sera, NP

## 2021-03-21 NOTE — Progress Notes (Signed)
ROB reports no complaints today.

## 2021-03-22 ENCOUNTER — Ambulatory Visit: Payer: Medicaid Other | Admitting: *Deleted

## 2021-03-22 ENCOUNTER — Ambulatory Visit: Payer: Medicaid Other | Attending: Obstetrics

## 2021-03-22 ENCOUNTER — Encounter: Payer: Self-pay | Admitting: *Deleted

## 2021-03-22 VITALS — BP 113/68 | HR 97

## 2021-03-22 DIAGNOSIS — Z148 Genetic carrier of other disease: Secondary | ICD-10-CM | POA: Diagnosis present

## 2021-03-22 DIAGNOSIS — Z3480 Encounter for supervision of other normal pregnancy, unspecified trimester: Secondary | ICD-10-CM | POA: Diagnosis not present

## 2021-04-05 ENCOUNTER — Ambulatory Visit: Payer: Medicaid Other | Attending: Women's Health

## 2021-04-07 ENCOUNTER — Inpatient Hospital Stay (HOSPITAL_COMMUNITY)
Admission: AD | Admit: 2021-04-07 | Discharge: 2021-04-07 | Disposition: A | Discharge status: Home / Self Care | Payer: Medicaid Other | Attending: Obstetrics and Gynecology | Admitting: Obstetrics and Gynecology

## 2021-04-07 ENCOUNTER — Encounter (HOSPITAL_COMMUNITY): Payer: Self-pay | Admitting: Obstetrics and Gynecology

## 2021-04-07 ENCOUNTER — Other Ambulatory Visit: Payer: Self-pay

## 2021-04-07 DIAGNOSIS — O26899 Other specified pregnancy related conditions, unspecified trimester: Secondary | ICD-10-CM

## 2021-04-07 DIAGNOSIS — Z3A24 24 weeks gestation of pregnancy: Secondary | ICD-10-CM | POA: Diagnosis not present

## 2021-04-07 DIAGNOSIS — O26892 Other specified pregnancy related conditions, second trimester: Secondary | ICD-10-CM

## 2021-04-07 DIAGNOSIS — R1030 Lower abdominal pain, unspecified: Secondary | ICD-10-CM | POA: Diagnosis not present

## 2021-04-07 DIAGNOSIS — R109 Unspecified abdominal pain: Secondary | ICD-10-CM | POA: Diagnosis not present

## 2021-04-07 DIAGNOSIS — Z3689 Encounter for other specified antenatal screening: Secondary | ICD-10-CM

## 2021-04-07 DIAGNOSIS — Z148 Genetic carrier of other disease: Secondary | ICD-10-CM

## 2021-04-07 DIAGNOSIS — Z8249 Family history of ischemic heart disease and other diseases of the circulatory system: Secondary | ICD-10-CM | POA: Diagnosis not present

## 2021-04-07 LAB — URINALYSIS, ROUTINE W REFLEX MICROSCOPIC
Bilirubin Urine: NEGATIVE
Glucose, UA: NEGATIVE mg/dL
Hgb urine dipstick: NEGATIVE
Ketones, ur: NEGATIVE mg/dL
Leukocytes,Ua: NEGATIVE
Nitrite: NEGATIVE
Protein, ur: NEGATIVE mg/dL
Specific Gravity, Urine: 1.015 (ref 1.005–1.030)
pH: 6 (ref 5.0–8.0)

## 2021-04-07 LAB — WET PREP, GENITAL
Clue Cells Wet Prep HPF POC: NONE SEEN
Sperm: NONE SEEN
Trich, Wet Prep: NONE SEEN
Yeast Wet Prep HPF POC: NONE SEEN

## 2021-04-07 NOTE — MAU Note (Signed)
PT SAYS SHE HAS CRAMPING IN LOWER ABD - STARTED AT 8 PM- TOOK XS TYLENOL 2 TABS- WITH SOME RELIEF  FEELS BABY MOVIING  PNC WITH FAMINA  LAST SEX- WED

## 2021-04-07 NOTE — MAU Provider Note (Signed)
History     CSN: [id number]  Arrival date and time: 04/07/21 0301   Event Date/Time   First Provider Initiated Contact with Patient 04/07/21 0406      No chief complaint on file.  Lorraine Burton is a 25 y.o. G3P1011 at 69w0dwho receives care at [facility].  She presents today for abdominal pain.  Patient states her pain started around 8pm and has been intermittent.  She states the pain is located in her lower abdominal area and "is just tight."  She states she took 2 tylenol at time of onset with some relief.  She endorses fetal movement and denies back pain and vaginal concerns including discharge and bleeding.  Patient reports sexual activity on Wednesday, but without pain or discomfort.  Patient also denies issues with urination.   OB History     Gravida  3   Para  1   Term  1   Preterm      AB  1   Living  1      SAB  1   IAB      Ectopic      Multiple      Live Births  1           Past Medical History:  Diagnosis Date   Medical history non-contributory     Past Surgical History:  Procedure Laterality Date   NO PAST SURGERIES      Family History  Problem Relation Age of Onset   Hypertension Mother     Social History   Tobacco Use   Smoking status: Never   Smokeless tobacco: Never  Vaping Use   Vaping Use: Never used  Substance Use Topics   Alcohol use: No   Drug use: No    Allergies: No Known Allergies  Medications Prior to Admission  Medication Sig Dispense Refill Last Dose   acetaminophen (TYLENOL) 500 MG tablet Take 2 tablets (1,000 mg total) by mouth every 8 (eight) hours as needed for mild pain, moderate pain or headache. 100 tablet 0 04/06/2021   metoCLOPramide (REGLAN) 10 MG tablet Take 1 tablet (10 mg total) by mouth 4 (four) times daily as needed for nausea or vomiting (Headache). 30 tablet 2 04/06/2021   Prenatal Vit-Fe Fumarate-FA (PRENATAL MULTIVITAMIN) TABS tablet Take 1 tablet by mouth daily at 12 noon.   04/06/2021    Blood Pressure Monitoring (BLOOD PRESSURE KIT) DEVI 1 Device by Does not apply route as needed. 1 each 0    cetirizine (ZYRTEC ALLERGY) 10 MG tablet Take 1 tablet (10 mg total) by mouth daily. 30 tablet 0 More than a month   fluticasone (FLONASE) 50 MCG/ACT nasal spray Place 2 sprays into both nostrils daily. 16 g 2 More than a month   metroNIDAZOLE (FLAGYL) 500 MG tablet Take 1 tablet (500 mg total) by mouth 2 (two) times daily. (Patient not taking: Reported on 03/22/2021) 14 tablet 2    Misc. Devices (GOJJI WEIGHT SCALE) MISC 1 Device by Does not apply route as needed. 1 each 0     Review of Systems  Gastrointestinal:  Positive for abdominal pain. Negative for constipation, diarrhea, nausea and vomiting.  Genitourinary:  Negative for difficulty urinating, dyspareunia, dysuria, vaginal bleeding and vaginal discharge.  Physical Exam   Blood pressure 114/66, pulse 93, temperature 98.3 F (36.8 C), temperature source Oral, resp. rate 18, height _0  (1.727 m), weight 70.9 kg, last menstrual period 10/21/2020, unknown if currently breastfeeding.  Physical Exam Vitals reviewed.  Exam conducted with a chaperone present.  Constitutional:      Appearance: Normal appearance.  HENT:     Head: Normocephalic and atraumatic.  Eyes:     Conjunctiva/sclera: Conjunctivae normal.  Cardiovascular:     Rate and Rhythm: Normal rate.  Pulmonary:     Effort: Pulmonary effort is normal. No respiratory distress.  Abdominal:     General: Bowel sounds are normal.  Genitourinary:    Comments: Speculum Exam: -Normal External Genitalia: Non tender, no apparent discharge at introitus.  -Vaginal Vault: Pink mucosa with good rugae. Scant amt of whitish discharge -wet prep collected -Cervix:Pink, no lesions, cysts, or polyps.  Appears closed. No active bleeding from os-GC/CT collected -Bimanual Exam:  Closed   Musculoskeletal:        General: Normal range of motion.     Cervical back: Normal range of motion.   Skin:    General: Skin is warm and dry.  Neurological:     Mental Status: She is alert and oriented to person, place, and time.  Psychiatric:        Mood and Affect: Mood normal.        Behavior: Behavior normal.        Thought Content: Thought content normal.     Fetal Assessment 145 bpm, Mod Var, -Decels, +Accels Toco: No Ctx graphed  MAU Course   Results for orders placed or performed during the hospital encounter of 04/07/21 (from the past 24 hour(s))  Urinalysis, Routine w reflex microscopic Urine, Clean Catch     Status: None   Collection Time: 04/07/21  3:29 AM  Result Value Ref Range   Color, Urine YELLOW YELLOW   APPearance CLEAR CLEAR   Specific Gravity, Urine 1.015 1.005 - 1.030   pH 6.0 5.0 - 8.0   Glucose, UA NEGATIVE NEGATIVE mg/dL   Hgb urine dipstick NEGATIVE NEGATIVE   Bilirubin Urine NEGATIVE NEGATIVE   Ketones, ur NEGATIVE NEGATIVE mg/dL   Protein, ur NEGATIVE NEGATIVE mg/dL   Nitrite NEGATIVE NEGATIVE   Leukocytes,Ua NEGATIVE NEGATIVE   No results found.  MDM PE Labs: UA Wet Prep, GC/CT EFM Assessment and Plan  25 year old G3P1011  SIUP at 24 weeks Cat I FT Abdominal Pain  -POC Reviewed -Exam performed and findings discussed. -Cultures collected and pending.  -Will await results and reassess.   Maryann Conners MSN, CNM 04/07/2021, 4:06 AM   Reassessment (5:02 AM)  -Results return negative. -Provider to bedside to discuss. -Informed that GC/CT pending -Patient reports no change in pain of 2/10. -Briefly educated on normalcy of abdominal cramping in pregnancy. -Encouraged to take tylenol as needed. -Patient requests and given note regarding evaluation today. -Encouraged to call or return to MAU if symptoms worsen or with the onset of new symptoms. -Discharged to home in stable condition.  Maryann Conners MSN, CNM Advanced Practice Provider, [facility]

## 2021-04-07 NOTE — Progress Notes (Signed)
Pt asked security for a cab voucher.  Pt address [place] less than 3 miles from here.  I provided a bus pass.  Conley Simmonds BSN, Higher education careers adviser   [facility] [employer] [phone] [email or web address]

## 2021-04-09 LAB — GC/CHLAMYDIA PROBE AMP (~~LOC~~) NOT AT ARMC
Chlamydia: NEGATIVE
Comment: NEGATIVE
Comment: NORMAL
Neisseria Gonorrhea: NEGATIVE

## 2021-04-18 ENCOUNTER — Other Ambulatory Visit: Payer: Self-pay

## 2021-04-18 ENCOUNTER — Ambulatory Visit (INDEPENDENT_AMBULATORY_CARE_PROVIDER_SITE_OTHER): Payer: Medicaid Other | Admitting: Women's Health

## 2021-04-18 VITALS — BP 101/67 | HR 79 | Wt 157.0 lb

## 2021-04-18 DIAGNOSIS — Z3A25 25 weeks gestation of pregnancy: Secondary | ICD-10-CM

## 2021-04-18 DIAGNOSIS — Z148 Genetic carrier of other disease: Secondary | ICD-10-CM

## 2021-04-18 DIAGNOSIS — Z3480 Encounter for supervision of other normal pregnancy, unspecified trimester: Secondary | ICD-10-CM

## 2021-04-18 DIAGNOSIS — A599 Trichomoniasis, unspecified: Secondary | ICD-10-CM

## 2021-04-18 NOTE — Progress Notes (Signed)
Pt reports fetal movement, denies pain.  

## 2021-04-18 NOTE — Progress Notes (Signed)
Subjective:  Lorraine Burton is a 25 y.o. G3P1011 at [redacted]w[redacted]d being seen today for ongoing prenatal care.  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  Patient reports no complaints.  Contractions: Not present. Vag. Bleeding: None.  Movement: Present. Denies leaking of fluid.   The following portions of the patient's history were reviewed and updated as appropriate: allergies, current medications, past family history, past medical history, past social history, past surgical history and problem list. Problem list updated.  Objective:   Vitals:   04/18/21 1434  BP: 101/67  Pulse: 79  Weight: 157 lb (71.2 kg)    Fetal Status: Fetal Heart Rate (bpm): 138 Fundal Height: 26 cm Movement: Present     General:  Alert, oriented and cooperative. Patient is in no acute distress.  Skin: Skin is warm and dry. No rash noted.   Cardiovascular: Normal heart rate noted  Respiratory: Normal respiratory effort, no problems with respiration noted  Abdomen: Soft, gravid, appropriate for gestational age. Pain/Pressure: Absent     Pelvic: Vag. Bleeding: None     Cervical exam deferred        Extremities: Normal range of motion.  Edema: Trace  Mental Status: Normal mood and affect. Normal behavior. Normal judgment and thought content.   Urinalysis:      Assessment and Plan:  Pregnancy: G3P1011 at [redacted]w[redacted]d  1. Supervision of other normal pregnancy, antepartum -GTT/labs next visit -Tdap next visit, info given  2. Genetic carrier -GC ordered 03/21/2021  3. Trichomonas infection -03/13/2021 - neg  4. [redacted] weeks gestation of pregnancy   Preterm labor symptoms and general obstetric precautions including but not limited to vaginal bleeding, contractions, leaking of fluid and fetal movement were reviewed in detail with the patient. I discussed the  assessment and treatment plan with the patient. The patient was provided an opportunity to ask questions and all were answered. The patient agreed with the plan and demonstrated an understanding of the instructions. The patient was advised to call back or seek an in-person office evaluation/go to MAU at [facility] for any urgent or concerning symptoms. Please refer to After Visit Summary for other counseling recommendations.  Return in about 2 weeks (around 05/02/2021) for in-person LOB/GTT/labs.   Vic Esco, Odie Sera, NP

## 2021-04-18 NOTE — Patient Instructions (Addendum)
Maternity Assessment Unit (MAU)  The Maternity Assessment Unit (MAU) is located at the [facility] and [place] at [facility]. The address is: [address] [place], [place], [place] [address]. Please see map below for additional directions.    The Maternity Assessment Unit is designed to help you during your pregnancy, and for up to 6 weeks after delivery, with any pregnancy- or postpartum-related emergencies, if you think you are in labor, or if your water has broken. For example, if you experience nausea and vomiting, vaginal bleeding, severe abdominal or pelvic pain, elevated blood pressure or other problems related to your pregnancy or postpartum time, please come to the Maternity Assessment Unit for assistance.       Preterm Labor The normal length of a pregnancy is 39-41 weeks. Preterm labor is when labor starts before 37 completed weeks of pregnancy. Babies who are born prematurely and survive may not be fully developed and may be at an increased risk for long-term problems such as cerebral palsy, developmental delays, and vision andhearing problems. Babies who are born too early may have problems soon after birth. Premature babies may have problems regulating blood sugar, body temperature, heart rate, and breathing rate. These babies often have trouble with feeding. The risk ofhaving problems is highest for babies who are born before 39 weeks of pregnancy. What are the causes? The exact cause of this condition is not known. What increases the risk? You are more likely to have preterm labor if you have certain risk factors that relate to your medical history, problems with present and past pregnancies, andlifestyle factors. Medical history You have abnormalities of the uterus, including a short cervix. You have STIs (sexually transmitted infections) or other infections of the urinary tract and the vagina. You have chronic illnesses, such as blood clotting  problems, diabetes, or high blood pressure. You are overweight or underweight. Present and past pregnancies You have had preterm labor before. You are pregnant with twins or other multiples. You have been diagnosed with a condition in which the placenta covers your cervix (placenta previa). You waited less than 18 months between giving birth and becoming pregnant again. Your unborn baby has some abnormalities. You have vaginal bleeding during pregnancy. You became pregnant through in vitro fertilization (IVF). Lifestyle and environmental factors You use tobacco products or drink alcohol. You use drugs. You have stress and no social support. You experience domestic violence. You are exposed to certain chemicals or environmental pollutants. Other factors You are younger than age 94 or older than age 43. What are the signs or symptoms? Symptoms of this condition include: Cramps similar to those that can happen during a menstrual period. The cramps may happen with diarrhea. Pain in the abdomen or lower back. Regular contractions that may feel like tightening of the abdomen. A feeling of increased pressure in the pelvis. Increased watery or bloody mucus discharge from the vagina. Water breaking (ruptured amniotic sac). How is this diagnosed? This condition is diagnosed based on: Your medical history and a physical exam. A pelvic exam. An ultrasound. Monitoring your uterus for contractions. Other tests, including: A swab of the cervix to check for a chemical called fetal fibronectin. Urine tests. How is this treated? Treatment for this condition depends on the length of your pregnancy, your condition, and the health of your baby. Treatment may include: Taking medicines, such as: Hormone medicines. These may be given early in pregnancy to help support the pregnancy. Medicines to stop contractions. Medicines to  help mature the baby's lungs. These may be prescribed if the risk of  delivery is high. Medicines to help protect your baby from brain and nerve complications such as cerebral palsy. Bed rest. If the labor happens before 34 weeks of pregnancy, you may need to stay in the hospital. Delivery of the baby. Follow these instructions at home:  Do not use any products that contain nicotine or tobacco. These products include cigarettes, chewing tobacco, and vaping devices, such as e-cigarettes. If you need help quitting, ask your health care provider. Do not drink alcohol. Take over-the-counter and prescription medicines only as told by your health care provider. Rest as told by your health care provider. Return to your normal activities as told by your health care provider. Ask your health care provider what activities are safe for you. Keep all follow-up visits. This is important. How is this prevented? To increase your chance of having a full-term pregnancy: Do not use drugs or take medicines that have not been prescribed to you during your pregnancy. Talk with your health care provider before taking any herbal supplements, even if you have been taking them regularly. Make sure you gain a healthy amount of weight during your pregnancy. Watch for infection. If you think that you might have an infection, get it checked right away. Symptoms of infection may include: Fever. Abnormal vaginal discharge or discharge that smells bad. Pain or burning with urination. Needing to urinate urgently. Frequently urinating or passing small amounts of urine frequently. Blood in your urine or urine that smells bad or unusual. Where to find more information U.S. Department of Health and Programmer, systems on Women's Health: [place]Be[place]Signs.tn [employer] of Obstetricians and Gynecologists: www.acog.org Centers for Disease Control and Prevention, Preterm Birth: http://www.wolf.info/ Contact a health care provider if: You think you are going into preterm labor. You have signs  or symptoms of preterm labor. You have symptoms of infection. Get help right away if: You are having regular, painful contractions every 5 minutes or less. Your water breaks. Summary Preterm labor is labor that starts before you reach 37 weeks of pregnancy. Delivering your baby early increases your baby's risk of developing long-term problems. You are more likely to have preterm labor if you have certain risk factors that relate to your medical history, problems with present and past pregnancies, and lifestyle factors. Keep all follow-up visits. This is important. Contact a health care provider if you have signs or symptoms of preterm labor. This information is not intended to replace advice given to you by your health care provider. Make sure you discuss any questions you have with your healthcare provider. Document Revised: 09/12/2020 Document Reviewed: 09/12/2020 Elsevier Patient Education  [place].       Oral Glucose Tolerance Test During Pregnancy Why am I having this test? The oral glucose tolerance test (OGTT) is done to check how your body processes blood sugar (glucose). This is one of several tests used to diagnose diabetes that develops during pregnancy (gestational diabetes mellitus). Gestational diabetes is a short-term form of diabetes that some women develop while they are pregnant. It usually occurs during the second trimesterof pregnancy and goes away after delivery. Testing, or screening, for gestational diabetes usually occurs at weeks 24-28 of pregnancy. You may have the OGTT test after having a 1-hour glucose screening test if the results from that test indicate that you may have gestational diabetes. This test may also be needed if: You have a history of gestational  diabetes. There is a history of giving birth to very large babies or of losing pregnancies (having stillbirths). You have signs and symptoms of diabetes, such as: Changes in your  eyesight. Tingling or numbness in your hands or feet. Changes in hunger, thirst, and urination, and these are not explained by your pregnancy. What is being tested? This test measures the amount of glucose in your blood at different timesduring a period of 3 hours. This shows how well your body can process glucose. What kind of sample is taken?  Blood samples are required for this test. They are usually collected byinserting a needle into a blood vessel. How do I prepare for this test? For 3 days before your test, eat normally. Have plenty of carbohydrate-rich foods. Follow instructions from your health care provider about: Eating or drinking restrictions on the day of the test. You may be asked not to eat or drink anything other than water (to fast) starting 8-10 hours before the test. Changing or stopping your regular medicines. Some medicines may interfere with this test. Tell a health care provider about: All medicines you are taking, including vitamins, herbs, eye drops, creams, and over-the-counter medicines. Any blood disorders you have. Any surgeries you have had. Any medical conditions you have. What happens during the test? First, your blood glucose will be measured. This is referred to as your fasting blood glucose because you fasted before the test. Then, you will drink a glucose solution that contains a certain amount of glucose. Your blood glucosewill be measured again 1, 2, and 3 hours after you drink the solution. This test takes about 3 hours to complete. You will need to stay at the testing location during this time. During the testing period: Do not eat or drink anything other than the glucose solution. Do not exercise. Do not use any products that contain nicotine or tobacco, such as cigarettes, e-cigarettes, and chewing tobacco. These can affect your test results. If you need help quitting, ask your health care provider. The testing procedure may vary among health care  providers and hospitals. How are the results reported? Your results will be reported as milligrams of glucose per deciliter of blood (mg/dL) or millimoles per liter (mmol/L). There is more than one source for screening and diagnosis reference values used to diagnose gestational diabetes. Your health care provider will compare your results to normal values that were established after testing a large group of people (reference values). Reference values may vary among labs and hospitals. For this test (Carpenter-Coustan), reference values are: Fasting: 95 mg/dL (5.3 mmol/L). 1 hour: 180 mg/dL (10.0 mmol/L). 2 hour: 155 mg/dL (8.6 mmol/L). 3 hour: 140 mg/dL (7.8 mmol/L). What do the results mean? Results below the reference values are considered normal. If two or more of your blood glucose levels are at or above the reference values, you may be diagnosed with gestational diabetes. If only one level is high, your healthcare provider may suggest repeat testing or other tests to confirm a diagnosis. Talk with your health care provider about what your results mean. Questions to ask your health care provider Ask your health care provider, or the department that is doing the test: When will my results be ready? How will I get my results? What are my treatment options? What other tests do I need? What are my next steps? Summary The oral glucose tolerance test (OGTT) is one of several tests used to diagnose diabetes that develops during pregnancy (gestational diabetes mellitus). Gestational diabetes is   a short-term form of diabetes that some women develop while they are pregnant. You may have the OGTT test after having a 1-hour glucose screening test if the results from that test show that you may have gestational diabetes. You may also have this test if you have any symptoms or risk factors for this type of diabetes. Talk with your health care provider about what your results mean. This information is not  intended to replace advice given to you by your health care provider. Make sure you discuss any questions you have with your healthcare provider. Document Revised: 02/17/2020 Document Reviewed: 02/17/2020 Elsevier Patient Education  2022 Elsevier Inc.       Tdap (Tetanus, Diphtheria, Pertussis) Vaccine: What You Need to Know 1. Why get vaccinated? Tdap vaccine can prevent tetanus, diphtheria, and pertussis. Diphtheria and pertussis spread from person to person. Tetanus enters the body through cuts or wounds. TETANUS (T) causes painful stiffening of the muscles. Tetanus can lead to serious health problems, including being unable to open the mouth, having trouble swallowing and breathing, or death. DIPHTHERIA (D) can lead to difficulty breathing, heart failure, paralysis, or death. PERTUSSIS (aP), also known as "whooping cough," can cause uncontrollable, violent coughing that makes it hard to breathe, eat, or drink. Pertussis can be extremely serious especially in babies and young children, causing pneumonia, convulsions, brain damage, or death. In teens and adults, it can cause weight loss, loss of bladder control, passing out, and rib fractures from severe coughing. 2. Tdap vaccine Tdap is only for children 7 years and older, adolescents, and adults.  Adolescents should receive a single dose of Tdap, preferably at age 1 or 12 years. Pregnant people should get a dose of Tdap during every pregnancy, preferably during the early part of the third trimester, to help protect the newborn from pertussis. Infants are most at risk for severe, life-threatening complications frompertussis. Adults who have never received Tdap should get a dose of Tdap. Also, adults should receive a booster dose of either Tdap or Td (a different vaccine that protects against tetanus and diphtheria but not pertussis) every 10 years, or after 5 years in the case of a severe or dirty wound or burn. Tdap may be given at the  same time as other vaccines. 3. Talk with your health care provider Tell your vaccine provider if the person getting the vaccine: Has had an allergic reaction after a previous dose of any vaccine that protects against tetanus, diphtheria, or pertussis, or has any severe, life-threatening allergies Has had a coma, decreased level of consciousness, or prolonged seizures within 7 days after a previous dose of any pertussis vaccine (DTP, DTaP, or Tdap) Has seizures or another nervous system problem Has ever had Guillain-Barr Syndrome (also called "GBS") Has had severe pain or swelling after a previous dose of any vaccine that protects against tetanus or diphtheria In some cases, your health care provider may decide to postpone Tdapvaccination until a future visit. People with minor illnesses, such as a cold, may be vaccinated. People who are moderately or severely ill should usually wait until they recover beforegetting Tdap vaccine.  Your health care provider can give you more information. 4. Risks of a vaccine reaction Pain, redness, or swelling where the shot was given, mild fever, headache, feeling tired, and nausea, vomiting, diarrhea, or stomachache sometimes happen after Tdap vaccination. People sometimes faint after medical procedures, including vaccination. Tellyour provider if you feel dizzy or have vision changes or ringing in the ears.  As with any medicine, there is a very remote chance of a vaccine causing asevere allergic reaction, other serious injury, or death. 5. What if there is a serious problem? An allergic reaction could occur after the vaccinated person leaves the clinic. If you see signs of a severe allergic reaction (hives, swelling of the face and throat, difficulty breathing, a fast heartbeat, dizziness, or weakness), call 9-1-1and get the person to the nearest hospital. For other signs that concern you, call your health care provider.  Adverse reactions should be reported  to the Vaccine Adverse Event Reporting System (VAERS). Your health care provider will usually file this report, or you can do it yourself. Visit the VAERS website at www.vaers.LAgents.no or call 680-327-9646. VAERS is only for reporting reactions, and VAERS staff members do not give medical advice. 6. The National Vaccine Injury Compensation Program The [employer] Vaccine Injury Compensation Program (VICP) is a federal program that was created to compensate people who may have been injured by certain vaccines. Claims regarding alleged injury or death due to vaccination have a time limit for filing, which may be as short as two years. Visit the VICP website at SpiritualWord.at or call 743-867-9846to learn about the program and about filing a claim. 7. How can I learn more? Ask your health care provider. Call your local or state health department. Visit the website of the Food and Drug Administration (FDA) for vaccine package inserts and additional information at FinderList.no. Contact the Centers for Disease Control and Prevention (CDC): Call (361)028-0062 (1-800-CDC-INFO) or Visit CDC's website at PicCapture.uy. Vaccine Information Statement Tdap (Tetanus, Diphtheria, Pertussis) Vaccine(04/28/2020) This information is not intended to replace advice given to you by your health care provider. Make sure you discuss any questions you have with your healthcare provider. Document Revised: 05/24/2020 Document Reviewed: 05/24/2020 Elsevier Patient Education  2022 [employer].

## 2021-05-01 ENCOUNTER — Other Ambulatory Visit: Payer: Medicaid Other

## 2021-05-01 ENCOUNTER — Ambulatory Visit (INDEPENDENT_AMBULATORY_CARE_PROVIDER_SITE_OTHER): Payer: Medicaid Other | Admitting: Obstetrics and Gynecology

## 2021-05-01 ENCOUNTER — Other Ambulatory Visit: Payer: Self-pay

## 2021-05-01 VITALS — BP 108/73 | HR 96 | Wt 160.2 lb

## 2021-05-01 DIAGNOSIS — Z3480 Encounter for supervision of other normal pregnancy, unspecified trimester: Secondary | ICD-10-CM

## 2021-05-01 NOTE — Progress Notes (Signed)
   PRENATAL VISIT NOTE  Subjective:  Lorraine Burton is a 25 y.o. G3P1011 at [redacted]w[redacted]d being seen today for ongoing prenatal care.  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  Patient reports no complaints.  Contractions: Not present. Vag. Bleeding: None.  Movement: Present. Denies leaking of fluid.   The following portions of the patient's history were reviewed and updated as appropriate: allergies, current medications, past family history, past medical history, past social history, past surgical history and problem list.   Objective:   Vitals:   05/01/21 1003  BP: 108/73  Pulse: 96  Weight: 160 lb 3.2 oz (72.7 kg)    Fetal Status: Fetal Heart Rate (bpm): 138 Fundal Height: 29 cm Movement: Present     General:  Alert, oriented and cooperative. Patient is in no acute distress.  Skin: Skin is warm and dry. No rash noted.   Cardiovascular: Normal heart rate noted  Respiratory: Normal respiratory effort, no problems with respiration noted  Abdomen: Soft, gravid, appropriate for gestational age.  Pain/Pressure: Absent     Pelvic: Cervical exam deferred        Extremities: Normal range of motion.  Edema: None  Mental Status: Normal mood and affect. Normal behavior. Normal judgment and thought content.   Assessment and Plan:  Pregnancy: G3P1011 at [redacted]w[redacted]d 1. Supervision of other normal pregnancy, antepartum  - Glucose Tolerance, 2 Hours w/1 Hour - CBC - RPR - HIV Antibody (routine testing w rflx) - Declined TDAP   Preterm labor symptoms and general obstetric precautions including but not limited to vaginal bleeding, contractions, leaking of fluid and fetal movement were reviewed in detail with the patient. Please refer to After Visit Summary for other counseling recommendations.   No follow-ups on file.  No future  appointments.  Venia Carbon, NP

## 2021-05-01 NOTE — Addendum Note (Signed)
Addended by: Harrel Lemon on: 05/01/2021 11:37 AM   Modules accepted: Orders

## 2021-05-01 NOTE — Progress Notes (Signed)
ROB 27.3 wks GTT CBC, HIV, RPR TDAP offered and declined Depression and anxiety screen:  PHQ9=2, GAD=6, saw Sue Lush today.

## 2021-05-02 ENCOUNTER — Encounter: Payer: Self-pay | Admitting: Obstetrics and Gynecology

## 2021-05-02 ENCOUNTER — Other Ambulatory Visit: Payer: Self-pay | Admitting: Obstetrics and Gynecology

## 2021-05-02 ENCOUNTER — Encounter: Payer: Medicaid Other | Admitting: Women's Health

## 2021-05-02 ENCOUNTER — Other Ambulatory Visit: Payer: Medicaid Other

## 2021-05-02 DIAGNOSIS — O24410 Gestational diabetes mellitus in pregnancy, diet controlled: Secondary | ICD-10-CM

## 2021-05-02 DIAGNOSIS — O24419 Gestational diabetes mellitus in pregnancy, unspecified control: Secondary | ICD-10-CM | POA: Insufficient documentation

## 2021-05-02 LAB — CBC
Hematocrit: 33.3 % — ABNORMAL LOW (ref 34.0–46.6)
Hemoglobin: 11 g/dL — ABNORMAL LOW (ref 11.1–15.9)
MCH: 31.5 pg (ref 26.6–33.0)
MCHC: 33 g/dL (ref 31.5–35.7)
MCV: 95 fL (ref 79–97)
Platelets: 225 10*3/uL (ref 150–450)
RBC: 3.49 x10E6/uL — ABNORMAL LOW (ref 3.77–5.28)
RDW: 10.6 % — ABNORMAL LOW (ref 11.7–15.4)
WBC: 10 10*3/uL (ref 3.4–10.8)

## 2021-05-02 LAB — RPR: RPR Ser Ql: NONREACTIVE

## 2021-05-02 LAB — GLUCOSE TOLERANCE, 2 HOURS W/ 1HR
Glucose, 1 hour: 153 mg/dL (ref 65–179)
Glucose, 2 hour: 97 mg/dL (ref 65–152)
Glucose, Fasting: 95 mg/dL — ABNORMAL HIGH (ref 65–91)

## 2021-05-02 LAB — HIV ANTIBODY (ROUTINE TESTING W REFLEX): HIV Screen 4th Generation wRfx: NONREACTIVE

## 2021-05-08 ENCOUNTER — Encounter: Payer: Medicaid Other | Attending: Obstetrics | Admitting: Registered"

## 2021-05-08 ENCOUNTER — Other Ambulatory Visit: Payer: Self-pay

## 2021-05-08 ENCOUNTER — Ambulatory Visit: Payer: Medicaid Other | Admitting: Registered"

## 2021-05-08 DIAGNOSIS — O24410 Gestational diabetes mellitus in pregnancy, diet controlled: Secondary | ICD-10-CM

## 2021-05-08 DIAGNOSIS — Z3A00 Weeks of gestation of pregnancy not specified: Secondary | ICD-10-CM | POA: Insufficient documentation

## 2021-05-08 DIAGNOSIS — O24419 Gestational diabetes mellitus in pregnancy, unspecified control: Secondary | ICD-10-CM

## 2021-05-08 MED ORDER — ACCU-CHEK SOFTCLIX LANCETS MISC: 1.0000 | Freq: Four times a day (QID) | 12 refills | Status: DC

## 2021-05-08 MED ORDER — ACCU-CHEK GUIDE VI STRP: ORAL_STRIP | 12 refills | Status: DC

## 2021-05-08 NOTE — Progress Notes (Signed)
GDM lancets and strips sent as requested by Heywood Bene, RD

## 2021-05-09 NOTE — Progress Notes (Signed)
Patient was seen for Gestational Diabetes self-management on 05/08/2021  Start time 1045 and End time 1115   Estimated due date: 07/28/21; [redacted]w[redacted]d  Clinical: Medications: reviewed Medical History: reviewed Labs: OGTT elevated FBS, A1c n/a%   Dietary and Lifestyle History: Patient went to wrong location for appointment. Limited time for assessment. Mostly used visit to teach use of glucometer and basics of carb counting  Physical Activity: Stress: Sleep:  24 hr Recall: First Meal: Snack: Second meal: Snack: Third meal: Snack: Beverages:  NUTRITION INTERVENTION  Nutrition education (E-1) on the following topics:   Initial Follow-up  []  []  Definition of Gestational Diabetes []  []  Why dietary management is important in controlling blood glucose [x]  []  Effects each nutrient has on blood glucose levels []  []  Simple carbohydrates vs complex carbohydrates []  []  Fluid intake []  []  Creating a balanced meal plan [x]  []  Carbohydrate counting  [x]  []  When to check blood glucose levels [x]  []  Proper blood glucose monitoring techniques []  []  Effect of stress and stress reduction techniques  []  []  Exercise effect on blood glucose levels, appropriate exercise during pregnancy []  []  Importance of limiting caffeine and abstaining from alcohol and smoking []  []  Medications used for blood sugar control during pregnancy []  []  Hypoglycemia and rule of 15 []  []  Postpartum self care  Blood glucose monitor given: Accu-chek Guide Me Lot Exp: 01/26/2022 CBG: 106 mg/dL   Patient instructed to monitor glucose levels: FBS: 60 - ? 95 mg/dL (some clinics use 90 for cutoff) 1 hour: ? 140 mg/dL 2 hour: ? mg/dL  Patient received handouts: Nutrition Diabetes and Pregnancy Carbohydrate Counting List  Patient will be seen for follow-up as needed.

## 2021-05-14 ENCOUNTER — Ambulatory Visit: Payer: Medicaid Other | Admitting: Licensed Clinical Social Worker

## 2021-05-14 NOTE — BH Specialist Note (Signed)
Appointment scheduled in error. Pt was requesting patient care coordinator Acie Fredrickson

## 2021-05-15 ENCOUNTER — Other Ambulatory Visit: Payer: Medicaid Other

## 2021-05-16 ENCOUNTER — Encounter: Payer: Medicaid Other | Admitting: Women's Health

## 2021-06-06 ENCOUNTER — Encounter: Payer: Medicaid Other | Admitting: Obstetrics and Gynecology

## 2021-06-13 ENCOUNTER — Inpatient Hospital Stay (HOSPITAL_COMMUNITY)
Admission: AD | Admit: 2021-06-13 | Discharge: 2021-06-13 | Disposition: A | Discharge status: Home / Self Care | Payer: Medicaid Other | Attending: Obstetrics and Gynecology | Admitting: Obstetrics and Gynecology

## 2021-06-13 ENCOUNTER — Encounter (HOSPITAL_COMMUNITY): Payer: Self-pay | Admitting: Obstetrics and Gynecology

## 2021-06-13 ENCOUNTER — Other Ambulatory Visit: Payer: Self-pay

## 2021-06-13 DIAGNOSIS — Z3A33 33 weeks gestation of pregnancy: Secondary | ICD-10-CM

## 2021-06-13 DIAGNOSIS — O26893 Other specified pregnancy related conditions, third trimester: Secondary | ICD-10-CM | POA: Diagnosis present

## 2021-06-13 DIAGNOSIS — R12 Heartburn: Secondary | ICD-10-CM

## 2021-06-13 DIAGNOSIS — O4703 False labor before 37 completed weeks of gestation, third trimester: Secondary | ICD-10-CM

## 2021-06-13 DIAGNOSIS — O479 False labor, unspecified: Secondary | ICD-10-CM

## 2021-06-13 DIAGNOSIS — O24410 Gestational diabetes mellitus in pregnancy, diet controlled: Secondary | ICD-10-CM | POA: Diagnosis not present

## 2021-06-13 DIAGNOSIS — O99891 Other specified diseases and conditions complicating pregnancy: Secondary | ICD-10-CM

## 2021-06-13 DIAGNOSIS — R112 Nausea with vomiting, unspecified: Secondary | ICD-10-CM | POA: Insufficient documentation

## 2021-06-13 HISTORY — DX: Gestational diabetes mellitus in pregnancy, unspecified control: O24.419

## 2021-06-13 LAB — URINALYSIS, MICROSCOPIC (REFLEX)

## 2021-06-13 LAB — URINALYSIS, ROUTINE W REFLEX MICROSCOPIC
Bilirubin Urine: NEGATIVE
Glucose, UA: NEGATIVE mg/dL
Hgb urine dipstick: NEGATIVE
Ketones, ur: 40 mg/dL — AB
Nitrite: NEGATIVE
Protein, ur: NEGATIVE mg/dL
Specific Gravity, Urine: 1.02 (ref 1.005–1.030)
pH: 6.5 (ref 5.0–8.0)

## 2021-06-13 LAB — FETAL FIBRONECTIN: Fetal Fibronectin: NEGATIVE

## 2021-06-13 LAB — GLUCOSE, CAPILLARY: Glucose-Capillary: 71 mg/dL (ref 70–99)

## 2021-06-13 MED ORDER — LACTATED RINGERS IV BOLUS
1000.0000 mL | Freq: Once | INTRAVENOUS | Status: AC
  Administered 2021-06-13: 1000 mL via INTRAVENOUS

## 2021-06-13 MED ORDER — ONDANSETRON HCL 4 MG/2ML IJ SOLN
4.0000 mg | Freq: Once | INTRAMUSCULAR | Status: AC
  Administered 2021-06-13: 4 mg via INTRAVENOUS
  Filled 2021-06-13: qty 2

## 2021-06-13 MED ORDER — FAMOTIDINE 20 MG PO TABS: 20.0000 mg | ORAL_TABLET | Freq: Every day | ORAL | 0 refills | Status: DC

## 2021-06-13 MED ORDER — FAMOTIDINE 20 MG IN NS 100 ML IVPB
20.0000 mg | Freq: Once | INTRAVENOUS | Status: AC
  Administered 2021-06-13: 20 mg via INTRAVENOUS
  Filled 2021-06-13: qty 100

## 2021-06-13 NOTE — MAU Note (Signed)
Received 25 y/o, [id number] 4/7 weeks, pt c/o N/V x 1 day, + fetal movement, -LOF, safety maintained.

## 2021-06-13 NOTE — MAU Note (Signed)
Just randomly was feeling dizzy this morning.  Threw up at noon(twice).  No longer feels dizzy, but her head hurts (did not take anything). Denies visual changes, epigastric pain, or increased swelling. Feeling pressure in lower abd, abd gets tight.

## 2021-06-13 NOTE — MAU Provider Note (Signed)
History     CSN: [id number]  Arrival date and time: 06/13/21 1409   Event Date/Time   First Provider Initiated Contact with Patient 06/13/21 1621      Chief Complaint  Patient presents with   pelvic pressure   Nausea   Heartburn   HPI Lorraine Burton is a 25 y.o. G3P1011 at [redacted]w[redacted]d who presents with nausea, heartburn, and pelvic pressure. Symptoms started this morning. Reports she vomited twice this morning & continues to have nausea. Also has associated heartburn which she hasn't been treating. Hasn't had food or drink since due to nausea. Since then has had intermittent pelvic pressure with abdominal tightening. Denies abdominal pain. Thinks these symptoms occur more than 10 times per hour. Goes to [facility] but has missed her last few appointments. Was diagnosed with gestational diabetes - has not been checking her blood sugars.  Denies diarrhea, fever, dysuria, vaginal bleeding, or LOF.   OB History     Gravida  3   Para  1   Term  1   Preterm      AB  1   Living  1      SAB  1   IAB      Ectopic      Multiple      Live Births  1           Past Medical History:  Diagnosis Date   Gestational diabetes     Past Surgical History:  Procedure Laterality Date   NO PAST SURGERIES      Family History  Problem Relation Age of Onset   Hypertension Mother     Social History   Tobacco Use   Smoking status: Never   Smokeless tobacco: Never  Vaping Use   Vaping Use: Never used  Substance Use Topics   Alcohol use: No   Drug use: No    Allergies: No Known Allergies  No medications prior to admission.    Review of Systems  Constitutional: Negative.   Gastrointestinal:  Positive for nausea and vomiting. Negative for abdominal pain, constipation and diarrhea.  Genitourinary:  Positive for pelvic pain (pressure). Negative for dysuria, vaginal bleeding and vaginal discharge.  Physical Exam   Blood pressure 104/64, pulse 82, temperature 98.1 F  (36.7 C), temperature source Oral, resp. rate 20, height 5\' 8"  (1.727 m), weight 77.7 kg, last menstrual period 10/21/2020, SpO2 99 %, unknown if currently breastfeeding.  Physical Exam Vitals and nursing note reviewed.  Constitutional:      Appearance: She is well-developed. She is not ill-appearing.  HENT:     Head: Normocephalic.  Eyes:     General: No scleral icterus. Pulmonary:     Effort: Pulmonary effort is normal. No respiratory distress.  Abdominal:     Palpations: Abdomen is soft.     Tenderness: There is no abdominal tenderness.  Genitourinary:    Comments: Dilation: 1 Effacement (%): Thick Cervical Position: Posterior Station: -3 Exam by:: [email or web address] NP  Skin:    General: Skin is warm and dry.  Neurological:     General: No focal deficit present.     Mental Status: She is alert.  Psychiatric:        Mood and Affect: Mood normal.        Behavior: Behavior normal.   NST:  Baseline: 130 bpm, Variability: Good {> 6 bpm), Accelerations: Reactive, Decelerations: Absent, and contractions Q3-5 minutes  MAU Course  Procedures Results for orders placed or performed during the  hospital encounter of 06/13/21 (from the past 24 hour(s))  Urinalysis, Routine w reflex microscopic Urine, Clean Catch     Status: Abnormal   Collection Time: 06/13/21  3:13 PM  Result Value Ref Range   Color, Urine ORANGE (A) YELLOW   APPearance HAZY (A) CLEAR   Specific Gravity, Urine 1.020 1.005 - 1.030   pH 6.5 5.0 - 8.0   Glucose, UA NEGATIVE NEGATIVE mg/dL   Hgb urine dipstick NEGATIVE NEGATIVE   Bilirubin Urine NEGATIVE NEGATIVE   Ketones, ur 40 (A) NEGATIVE mg/dL   Protein, ur NEGATIVE NEGATIVE mg/dL   Nitrite NEGATIVE NEGATIVE   Leukocytes,Ua SMALL (A) NEGATIVE  Urinalysis, Microscopic (reflex)     Status: Abnormal   Collection Time: 06/13/21  3:13 PM  Result Value Ref Range   RBC / HPF 0-5 0 - 5 RBC/hpf   WBC, UA 6-10 0 - 5 WBC/hpf   Bacteria, UA RARE (A) NONE SEEN    Squamous Epithelial / LPF 6-10 0 - 5   Mucus PRESENT   Glucose, capillary     Status: None   Collection Time: 06/13/21  4:41 PM  Result Value Ref Range   Glucose-Capillary 71 70 - 99 mg/dL  Fetal fibronectin     Status: None   Collection Time: 06/13/21  5:20 PM  Result Value Ref Range   Fetal Fibronectin NEGATIVE NEGATIVE    MDM Nausea/heartburn - given IV fluids, zofran, & pepcid. Reports resolution of symptoms. Will rx pepcid to take daily at home.   Contractions - mild contractions every 3-5 minutes lasting 30-80 second. Cervix dilated 1/thick, unchanged after 2 hours of monitoring. FFN negative. Reports contractions are not painful. No hx of PTD.   GDM- not checking blood sugars at home. Fasting BS in MAU is 71. Encouraged to start checking blood sugars 4x per day as previously instructed. Will send message to office to schedule ob appt asap due to her missed appointments & high risk status- also needs to be scheduled for growth ultrasound   Assessment and Plan   1. Braxton Hicks contractions   2. Heartburn during pregnancy in third trimester   3. Diet controlled gestational diabetes mellitus (GDM) in third trimester   4. [redacted] weeks gestation of pregnancy    -Rx pepcid -message sent to office for follow up -reviewed PTL precautions & reasons to return to MAU  Judeth Horn 06/13/2021, 8:30 PM

## 2021-06-14 LAB — CULTURE, OB URINE: Culture: NO GROWTH

## 2021-06-17 NOTE — Progress Notes (Signed)
OBSTETRICS PRENATAL VIRTUAL VISIT ENCOUNTER NOTE  [facility]: [facility]   Patient location: Home  I connected with [facility] on 06/19/21 at  3:30 PM EDT by MyChart Video Encounter and verified that I am speaking with the correct person using two identifiers. I discussed the limitations, risks, security and privacy concerns of performing an evaluation and management service virtually and the availability of in person appointments. I also discussed with the patient that there may be a patient responsible charge related to this service. The patient expressed understanding and agreed to proceed.  Subjective:  Lorraine Burton is a 25 y.o. G3P1011 at [redacted]w[redacted]d being seen today for ongoing prenatal care.  She is currently monitored for the following issues for this high-risk pregnancy and has Supervision of other normal pregnancy, antepartum; Genetic carrier; and Gestational diabetes on their problem list.  Patient reports no complaints.  Contractions: Irregular. Vag. Bleeding: None.  Movement: Present. Denies any leaking of fluid.   The following portions of the patient's history were reviewed and updated as appropriate: allergies, current medications, past family history, past medical history, past social history, past surgical history and problem list.   Objective:  There were no vitals filed for this visit.  Fetal Status:     Movement: Present     General:  Alert, oriented and cooperative. Patient is in no acute distress.  Respiratory: Normal respiratory effort, no problems with respiration noted  Mental Status: Normal mood and affect. Normal behavior. Normal judgment and thought content.  Rest of physical exam deferred due to type of encounter  Imaging: No results found.  Assessment and Plan:  Pregnancy: G3P1011 at [redacted]w[redacted]d 1. Diet controlled gestational diabetes mellitus (GDM) in third trimester - No recent growth - ordered - will try to do  next Thursday.  - CBGs are normal per pt. She 90 fastings, 95 post-prandial  2. Trichomonas infection - Remote history (1y ago) - 7/16 was negative   3. Supervision of other normal pregnancy, antepartum - She continues to do well - no worsening symptoms of PTL - Seen in MAU 9/21 for pressure - FFN neg - She is currently living with her sister in [place]. She is unsure if she will continue with [place] vs find a new provider there. In the interim, we will still proceed with [facility] as though she will come here.   4. 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   Preterm labor symptoms and general obstetric precautions including but not limited to vaginal bleeding, contractions, leaking of fluid and fetal movement were reviewed in detail with the patient. I discussed the assessment and treatment plan with the patient. The patient was provided an opportunity to ask questions and all were answered. The patient agreed with the plan and demonstrated an understanding of the instructions. The patient was advised to call back or seek an in-person office evaluation/go to MAU at [facility] for any urgent or concerning symptoms. Please refer to After Visit Summary for other counseling recommendations.   I provided 4 minutes of face-to-face time during this encounter. We then switched to telephone and did 9 minutes by telephone due to our face to face working and we could only type to each other.   No follow-ups on file.  Future Appointments  Date Time Provider Department Center  06/19/2021  3:30  PM Milas Hock, MD [facility] None    Milas Hock, MD [facility]

## 2021-06-19 ENCOUNTER — Telehealth (INDEPENDENT_AMBULATORY_CARE_PROVIDER_SITE_OTHER): Payer: Medicaid Other | Admitting: Obstetrics and Gynecology

## 2021-06-19 ENCOUNTER — Encounter: Payer: Self-pay | Admitting: Obstetrics and Gynecology

## 2021-06-19 DIAGNOSIS — O24410 Gestational diabetes mellitus in pregnancy, diet controlled: Secondary | ICD-10-CM

## 2021-06-19 DIAGNOSIS — A599 Trichomoniasis, unspecified: Secondary | ICD-10-CM

## 2021-06-19 DIAGNOSIS — Z3A34 34 weeks gestation of pregnancy: Secondary | ICD-10-CM

## 2021-06-19 DIAGNOSIS — Z3480 Encounter for supervision of other normal pregnancy, unspecified trimester: Secondary | ICD-10-CM

## 2021-06-19 DIAGNOSIS — Z148 Genetic carrier of other disease: Secondary | ICD-10-CM

## 2021-06-28 ENCOUNTER — Encounter: Payer: Self-pay | Admitting: Obstetrics and Gynecology

## 2021-06-28 ENCOUNTER — Ambulatory Visit: Payer: Medicaid Other

## 2021-06-28 ENCOUNTER — Ambulatory Visit (INDEPENDENT_AMBULATORY_CARE_PROVIDER_SITE_OTHER): Payer: Medicaid Other | Admitting: Obstetrics and Gynecology

## 2021-06-28 ENCOUNTER — Other Ambulatory Visit: Payer: Self-pay

## 2021-06-28 VITALS — BP 109/74 | HR 83 | Wt 186.0 lb

## 2021-06-28 DIAGNOSIS — Z148 Genetic carrier of other disease: Secondary | ICD-10-CM

## 2021-06-28 DIAGNOSIS — Z3480 Encounter for supervision of other normal pregnancy, unspecified trimester: Secondary | ICD-10-CM

## 2021-06-28 DIAGNOSIS — O24410 Gestational diabetes mellitus in pregnancy, diet controlled: Secondary | ICD-10-CM

## 2021-06-28 NOTE — Progress Notes (Signed)
ROB [redacted]w[redacted]d Pt has U/S scheduled for tomorrow.  Pt forgot Blood sugar log states sugars have been around 90-95.  CC: None

## 2021-06-28 NOTE — Patient Instructions (Signed)

## 2021-06-28 NOTE — Progress Notes (Signed)
Subjective:  Lorraine Burton is a 25 y.o. G3P1011 at [redacted]w[redacted]d being seen today for ongoing prenatal care.  She is currently monitored for the following issues for this high-risk pregnancy and has Supervision of other normal pregnancy, antepartum; Genetic carrier; and Gestational diabetes on their problem list.  Patient reports general discomforts of pregnancy.  Contractions: Irritability. Vag. Bleeding: None.  Movement: Present. Denies leaking of fluid.   The following portions of the patient's history were reviewed and updated as appropriate: allergies, current medications, past family history, past medical history, past social history, past surgical history and problem list. Problem list updated.  Objective:   Vitals:   06/28/21 1505  BP: 109/74  Pulse: 83  Weight: 186 lb (84.4 kg)    Fetal Status: Fetal Heart Rate (bpm): 138   Movement: Present     General:  Alert, oriented and cooperative. Patient is in no acute distress.  Skin: Skin is warm and dry. No rash noted.   Cardiovascular: Normal heart rate noted  Respiratory: Normal respiratory effort, no problems with respiration noted  Abdomen: Soft, gravid, appropriate for gestational age. Pain/Pressure: Present     Pelvic:  Cervical exam deferred        Extremities: Normal range of motion.  Edema: Very deep pitting, indentation lasts a long time  Mental Status: Normal mood and affect. Normal behavior. Normal judgment and thought content.   Urinalysis:      Assessment and Plan:  Pregnancy: G3P1011 at [redacted]w[redacted]d  1. Supervision of other normal pregnancy, antepartum Stable GBS and vaginal cultures next visit  2. Diet controlled gestational diabetes mellitus (GDM) in third trimester CBG's in goal range Continue with diet Growth scan tomorrow  3. Genetic carrier Stable FOB declined testing  Preterm labor symptoms and general obstetric precautions including but not limited to vaginal bleeding, contractions, leaking of fluid and  fetal movement were reviewed in detail with the patient. Please refer to After Visit Summary for other counseling recommendations.  Return in about 1 week (around 07/05/2021) for OB visit, face to face, any provider.   Hermina Staggers, MD

## 2021-06-29 ENCOUNTER — Ambulatory Visit: Payer: Medicaid Other | Admitting: *Deleted

## 2021-06-29 ENCOUNTER — Other Ambulatory Visit: Payer: Self-pay | Admitting: Obstetrics and Gynecology

## 2021-06-29 ENCOUNTER — Ambulatory Visit: Payer: Medicaid Other | Attending: Obstetrics and Gynecology

## 2021-06-29 VITALS — BP 111/66 | HR 97

## 2021-06-29 DIAGNOSIS — Z3A35 35 weeks gestation of pregnancy: Secondary | ICD-10-CM

## 2021-06-29 DIAGNOSIS — O24410 Gestational diabetes mellitus in pregnancy, diet controlled: Secondary | ICD-10-CM

## 2021-06-29 DIAGNOSIS — Z3480 Encounter for supervision of other normal pregnancy, unspecified trimester: Secondary | ICD-10-CM | POA: Diagnosis present

## 2021-06-29 DIAGNOSIS — Z148 Genetic carrier of other disease: Secondary | ICD-10-CM

## 2021-06-30 ENCOUNTER — Encounter (HOSPITAL_COMMUNITY): Payer: Self-pay | Admitting: Family Medicine

## 2021-06-30 ENCOUNTER — Other Ambulatory Visit: Payer: Self-pay

## 2021-06-30 ENCOUNTER — Encounter (HOSPITAL_COMMUNITY): Payer: Self-pay | Admitting: Obstetrics and Gynecology

## 2021-06-30 ENCOUNTER — Inpatient Hospital Stay (HOSPITAL_COMMUNITY)
Admission: AD | Admit: 2021-06-30 | Discharge: 2021-06-30 | Disposition: A | Discharge status: Home / Self Care | Payer: Medicaid Other | Attending: Family Medicine | Admitting: Family Medicine

## 2021-06-30 ENCOUNTER — Inpatient Hospital Stay (EMERGENCY_DEPARTMENT_HOSPITAL)
Admission: AD | Admit: 2021-06-30 | Discharge: 2021-06-30 | Disposition: A | Discharge status: Home / Self Care | Payer: Medicaid Other | Source: Home / Self Care | Attending: Obstetrics and Gynecology | Admitting: Obstetrics and Gynecology

## 2021-06-30 DIAGNOSIS — O24410 Gestational diabetes mellitus in pregnancy, diet controlled: Secondary | ICD-10-CM

## 2021-06-30 DIAGNOSIS — O368330 Maternal care for abnormalities of the fetal heart rate or rhythm, third trimester, not applicable or unspecified: Secondary | ICD-10-CM | POA: Insufficient documentation

## 2021-06-30 DIAGNOSIS — O479 False labor, unspecified: Secondary | ICD-10-CM

## 2021-06-30 DIAGNOSIS — O4703 False labor before 37 completed weeks of gestation, third trimester: Secondary | ICD-10-CM | POA: Insufficient documentation

## 2021-06-30 DIAGNOSIS — Z3A36 36 weeks gestation of pregnancy: Secondary | ICD-10-CM | POA: Diagnosis not present

## 2021-06-30 DIAGNOSIS — Z148 Genetic carrier of other disease: Secondary | ICD-10-CM

## 2021-06-30 DIAGNOSIS — Z3689 Encounter for other specified antenatal screening: Secondary | ICD-10-CM

## 2021-06-30 MED ORDER — OXYCODONE HCL 5 MG PO TABS
10.0000 mg | ORAL_TABLET | Freq: Once | ORAL | Status: AC
  Administered 2021-06-30: 10 mg via ORAL
  Filled 2021-06-30: qty 2

## 2021-06-30 MED ORDER — LACTATED RINGERS IV BOLUS
1000.0000 mL | Freq: Once | INTRAVENOUS | Status: AC
  Administered 2021-06-30: 1000 mL via INTRAVENOUS

## 2021-06-30 MED ORDER — PAREGORIC 2 MG/5ML PO TINC: 5.0000 mL | ORAL | Status: DC | PRN

## 2021-06-30 MED ORDER — PROMETHAZINE HCL 25 MG PO TABS
25.0000 mg | ORAL_TABLET | Freq: Once | ORAL | Status: AC
  Administered 2021-06-30: 25 mg via ORAL
  Filled 2021-06-30: qty 1

## 2021-06-30 MED ORDER — BUTORPHANOL TARTRATE 1 MG/ML IJ SOLN
1.0000 mg | Freq: Once | INTRAMUSCULAR | Status: DC
  Filled 2021-06-30: qty 1

## 2021-06-30 NOTE — MAU Provider Note (Signed)
S: Ms. Lorraine Burton is a 25 y.o. G3P1011 at 103w0d  who presents to MAU today for labor evaluation.     Cervical exam by RN:  Dilation: 2.5 Effacement (%): 50 Station: -3 Exam by:: [employer], RN  Fetal Monitoring: Baseline: 120 Variability: moderate Accelerations: 15x15 Decelerations: none Contractions: Q2-5 minutes  MDM Discussed patient with RN. NST reviewed.   A: SIUP at [redacted]w[redacted]d  False labor  P: Discharge home Labor precautions and kick counts included in AVS Patient to follow-up with [facility] as scheduled  Patient may return to MAU as needed or when in labor   Judeth Horn, NP 06/30/2021 7:52 AM

## 2021-06-30 NOTE — MAU Note (Signed)
Presents with c/o ctxs every 5-6 minutes.  States was evaluated last night and cervix was 2.5cms.  Denies LOF, reports spotting.  Reports +FM.

## 2021-06-30 NOTE — Discharge Instructions (Signed)
The [employer]uit  This circuit takes at least 90 minutes to complete so clear your schedule and make mental preparations so you can relax in your environment. The second step requires a lot of pillows so gather them up before beginning Before starting, you should empty your bladder! Have a nice drink nearby, and make sure it has a straw! If you are having contractions, this circuit should be done through contractions, try not to change positions between steps Before you begin...  "I named this 'circuit' after my friend Deneen Harts, who shared and discussed it with me when I was working with a client whose labor seemed to be stalled out and no longer progressing... This circuit is useful to help get the baby lined up, ideally, in the "Left Occiput Anterior" (LOA) Position, both before labor begins and when some corrections need to be done during labor. Prenatally, this position set can help to rotate a baby. As a natural method of induction, this can help get things going if baby just needed a gentle nudge of position to set things off. To the best of my knowledge, this group of positions will not "hurt" a baby that is already lined up correctly." - Sharlyn Bologna   Step One: Open-knee Chest Stay in this position for 30 minutes, start in cat/cow, then drop your chest as low as you can to the bed or the floor and your bottom as high as you can. Knees should be fairly wide apart, and the angle between the torso/thighs should be wider than 90 degrees. Wiggle around, prop with lots of pillows and use this time to get totally relaxed. This position allows the baby to scoot out of the pelvis a bit and gives them room to rotate, shift their head position, etc. If the pregnant person finds it helpful, careful positioning with a rebozo under the belly, with gentle tension from a support person behind can help maintain this position for the full 30 minutes.  Step CHY:IFOYDXAJOIN Left Side  Lying Roll to your left side, bringing your top leg as high as possible and keeping your bottom leg straight. Roll forward as much as possible, again using a lot of pillows. Sink into the bed and relax some more. If you fall asleep, that's totally okay and you can stay there! If not, stay here for at least another half an hour. Try and get your top right leg up towards your head and get as rolled over onto your belly as much as possible. If you repeat the circuit during labor, try alternating left and right sides. We know the photo the left is actually right side... just flip the image in your head.  Step Three: Moving and Lunges (You CAN just go for a walk) Lunge, walk stairs facing sideways, 2 at a time, (have a spotter downstairs of you!), take a walk outside with one foot on the curb and the other on the street, sit on a birth ball and hula- anything that's upright and putting your pelvis in open, asymmetrical positions. Spend at least 30 minutes doing this one as well to give your baby a chance to move down. If you are lunging or stair or curb walking, you should lunge/walk/go up stairs in the direction that feels better to you. The key with the lunge is that the toes of the higher leg and mom's belly button should be at right angles. Do not lunge over your knee, that closes the pelvis.  Megan Trixie Dredge: Circuit  Creator - [email or web address] Sharlyn Bologna, CD, BDT (DONA), LCCE, FACCE: Supporting Content - [email or web address] Rulon Eisenmenger: Photography - [email or web address] Luther Hearing CD/CDT [facility]): Print and Webmaster - [email or web address] [employer] [email or web address]

## 2021-06-30 NOTE — MAU Note (Signed)
Pt reports she has been having contractions all day , currently about every 6 minutes. Reports positive fetal movement. States she has had diarrhea all day today also.

## 2021-06-30 NOTE — MAU Provider Note (Signed)
Event Date/Time   First Provider Initiated Contact with Patient 06/30/21 1435     S: Ms. Sinclaire Artiga is a 25 y.o. G3P1011 at [redacted]w[redacted]d  who presents to MAU today complaining contractions q 5-6 minutes since discharge last night for false labor. She denies vaginal bleeding. She denies LOF. She reports normal fetal movement.    O: BP 105/67 (BP Location: Right Arm)   Pulse 88   Temp 98.2 F (36.8 C) (Oral)   Resp 20   Ht 5\' 8"  (1.727 m)   Wt 183 lb 11.2 oz (83.3 kg)   LMP 10/21/2020   SpO2 100%   BMI 27.93 kg/m  GENERAL: Well-developed, well-nourished female in no acute distress.  HEAD: Normocephalic, atraumatic.  CHEST: Normal effort of breathing, regular heart rate ABDOMEN: Soft, nontender, gravid  Cervical exam (unchanged) Dilation: 2.5 Effacement (%): 50 Cervical Position: Posterior Station: -3 Presentation: Vertex Exam by:: K.Wilson,RN  Fetal Monitoring: reactive Baseline: 135 Variability: moderate Accelerations: 15x15 Decelerations: occasional variable (two) Contractions: q2-43min   A: SIUP at [redacted]w[redacted]d  False labor - discussed prodromal labor with patient and offered therapeutic rest with stadol/phenergan. Pt initially agreed then asked for oral meds (did not want a shot). Switched to oxycodone and phenergan. Suggested she do a [redacted]w[redacted]d and get into a tub with Epsom salt water for relief of contractions. Pt agreeable to plan.  P: Discharge home with therapeutic rest and [employer] instructions Follow up at [facility] as scheduled for ongoing prenatal care  [employer], Bernerd Limbo 06/30/2021 2:38 PM

## 2021-07-05 ENCOUNTER — Other Ambulatory Visit (HOSPITAL_COMMUNITY)
Admission: RE | Admit: 2021-07-05 | Discharge: 2021-07-05 | Disposition: A | Discharge status: Home / Self Care | Payer: Medicaid Other | Source: Ambulatory Visit | Attending: Obstetrics | Admitting: Obstetrics

## 2021-07-05 ENCOUNTER — Encounter: Payer: Self-pay | Admitting: Obstetrics

## 2021-07-05 ENCOUNTER — Ambulatory Visit (INDEPENDENT_AMBULATORY_CARE_PROVIDER_SITE_OTHER): Payer: Medicaid Other | Admitting: Obstetrics

## 2021-07-05 ENCOUNTER — Other Ambulatory Visit: Payer: Self-pay

## 2021-07-05 VITALS — Wt 180.9 lb

## 2021-07-05 DIAGNOSIS — O0990 Supervision of high risk pregnancy, unspecified, unspecified trimester: Secondary | ICD-10-CM

## 2021-07-05 DIAGNOSIS — Z3480 Encounter for supervision of other normal pregnancy, unspecified trimester: Secondary | ICD-10-CM | POA: Insufficient documentation

## 2021-07-05 DIAGNOSIS — O24410 Gestational diabetes mellitus in pregnancy, diet controlled: Secondary | ICD-10-CM

## 2021-07-05 LAB — OB RESULTS CONSOLE GC/CHLAMYDIA: Gonorrhea: NEGATIVE

## 2021-07-05 NOTE — Progress Notes (Addendum)
Subjective:  Lorraine Burton is a 25 y.o. G3P1011 at [redacted]w[redacted]d being seen today for ongoing prenatal care.  She is currently monitored for the following issues for this low-risk pregnancy and has Supervision of other normal pregnancy, antepartum; Genetic carrier; and Gestational diabetes on their problem list.  Patient reports no complaints.  Contractions: Irregular. Vag. Bleeding: None.  Movement: Present. Denies leaking of fluid.   The following portions of the patient's history were reviewed and updated as appropriate: allergies, current medications, past family history, past medical history, past social history, past surgical history and problem list. Problem list updated.  Objective:   Vitals:   07/05/21 1527  Weight: 180 lb 14.4 oz (82.1 kg)    Fetal Status:     Movement: Present     General:  Alert, oriented and cooperative. Patient is in no acute distress.  Skin: Skin is warm and dry. No rash noted.   Cardiovascular: Normal heart rate noted  Respiratory: Normal respiratory effort, no problems with respiration noted  Abdomen: Soft, gravid, appropriate for gestational age. Pain/Pressure: Present     Pelvic:  Cervical exam deferred        Extremities: Normal range of motion.  Edema: Mild pitting, slight indentation  Mental Status: Normal mood and affect. Normal behavior. Normal judgment and thought content.   Urinalysis:      Assessment and Plan:  Pregnancy: G3P1011 at [redacted]w[redacted]d  1. Supervision of high risk pregnancy, antepartum Rx: - Culture, beta strep (group b only) - Cervicovaginal ancillary only( [place])  2. Diet controlled gestational diabetes mellitus (GDM) in third trimester - good glucose control:  FBS's < 90    2 hr PP's < 120  Preterm labor symptoms and general obstetric precautions including but not limited to vaginal bleeding, contractions, leaking of fluid and fetal movement were reviewed in detail with the patient. Please refer to After Visit Summary for other  counseling recommendations.   Return in about 1 week (around 07/12/2021) for [facility].   Brock Bad, MD  07/05/21

## 2021-07-05 NOTE — Progress Notes (Signed)
Pt presents for ROB, GBS, and GC/CT. Pt reports CBG levels are WNL. Pt forgot to bring log.  PHQ9= 4 GAD7= 1

## 2021-07-06 ENCOUNTER — Other Ambulatory Visit: Payer: Self-pay

## 2021-07-06 ENCOUNTER — Inpatient Hospital Stay (HOSPITAL_COMMUNITY)
Admission: AD | Admit: 2021-07-06 | Discharge: 2021-07-07 | Disposition: A | Discharge status: Home / Self Care | Payer: Medicaid Other | Attending: Obstetrics & Gynecology | Admitting: Obstetrics & Gynecology

## 2021-07-06 DIAGNOSIS — A599 Trichomoniasis, unspecified: Secondary | ICD-10-CM

## 2021-07-06 DIAGNOSIS — Z148 Genetic carrier of other disease: Secondary | ICD-10-CM

## 2021-07-06 DIAGNOSIS — Z3689 Encounter for other specified antenatal screening: Secondary | ICD-10-CM

## 2021-07-06 DIAGNOSIS — A5901 Trichomonal vulvovaginitis: Secondary | ICD-10-CM | POA: Insufficient documentation

## 2021-07-06 DIAGNOSIS — Z3A37 37 weeks gestation of pregnancy: Secondary | ICD-10-CM | POA: Insufficient documentation

## 2021-07-06 DIAGNOSIS — O24410 Gestational diabetes mellitus in pregnancy, diet controlled: Secondary | ICD-10-CM

## 2021-07-06 DIAGNOSIS — O98313 Other infections with a predominantly sexual mode of transmission complicating pregnancy, third trimester: Secondary | ICD-10-CM | POA: Insufficient documentation

## 2021-07-06 DIAGNOSIS — O471 False labor at or after 37 completed weeks of gestation: Secondary | ICD-10-CM | POA: Insufficient documentation

## 2021-07-06 HISTORY — DX: Trichomoniasis, unspecified: A59.9

## 2021-07-06 LAB — CERVICOVAGINAL ANCILLARY ONLY
Bacterial Vaginitis (gardnerella): POSITIVE — AB
Candida Glabrata: NEGATIVE
Candida Vaginitis: NEGATIVE
Chlamydia: NEGATIVE
Comment: NEGATIVE
Comment: NEGATIVE
Comment: NEGATIVE
Comment: NEGATIVE
Comment: NEGATIVE
Comment: NORMAL
Neisseria Gonorrhea: NEGATIVE
Trichomonas: POSITIVE — AB

## 2021-07-06 NOTE — MAU Note (Addendum)
PT SAYS SHE HAD DR APPOINTMENT ON Thursday- CX RESULTED IN [facility]  AT 430- SHOWED POSITIVE FOR BV AND TRICH  [facility] WITH [facility] HAS BEEN HAVING UC'S - WAS HERE LAST Friday  SAT NIGHT -GAVE MEDS  LAST UC WAS 1 HR AGO

## 2021-07-07 ENCOUNTER — Encounter (HOSPITAL_COMMUNITY): Payer: Self-pay | Admitting: Obstetrics & Gynecology

## 2021-07-07 DIAGNOSIS — O98313 Other infections with a predominantly sexual mode of transmission complicating pregnancy, third trimester: Secondary | ICD-10-CM | POA: Diagnosis not present

## 2021-07-07 DIAGNOSIS — Z3A37 37 weeks gestation of pregnancy: Secondary | ICD-10-CM | POA: Diagnosis not present

## 2021-07-07 DIAGNOSIS — O471 False labor at or after 37 completed weeks of gestation: Secondary | ICD-10-CM

## 2021-07-07 DIAGNOSIS — A5901 Trichomonal vulvovaginitis: Secondary | ICD-10-CM | POA: Diagnosis not present

## 2021-07-07 MED ORDER — METRONIDAZOLE 500 MG PO TABS: 500.0000 mg | ORAL_TABLET | Freq: Two times a day (BID) | ORAL | 0 refills | Status: DC

## 2021-07-07 NOTE — MAU Provider Note (Signed)
S: Ms. Lorraine Burton is a 25 y.o. G3P1011 at [redacted]w[redacted]d  who presents to MAU today for labor evaluation.   Patient also with concern of recent trichomoniasis diagnosis and reqeusting treatment. Nurse reports that after 1 hour patient with one notable contraction and no cervical change.   Cervical exam by RN:     Fetal Monitoring: Baseline: 120 Variability: Moderate Accelerations: Present 15x15 Decelerations: None Contractions: Once possible inverted at 0112  MDM Discussed patient with RN. NST reviewed.   A: SIUP at [redacted]w[redacted]d  False labor Trichomoniasis  NST Reactive  P: Rx for Flagyl sent to pharmacy on file.  Patient declines EPT. Problem list updated. Discharge home Labor precautions and kick counts included in AVS Patient to follow-up with primary ob as scheduled  Patient may return to MAU as needed or when in labor   Gerrit Heck, [place] 07/07/2021 1:26 AM

## 2021-07-09 ENCOUNTER — Other Ambulatory Visit: Payer: Self-pay | Admitting: Obstetrics

## 2021-07-09 LAB — CULTURE, BETA STREP (GROUP B ONLY): Strep Gp B Culture: POSITIVE — AB

## 2021-07-10 ENCOUNTER — Telehealth: Payer: Self-pay

## 2021-07-10 NOTE — Telephone Encounter (Signed)
-----   Message from Brock Bad, MD sent at 07/09/2021 12:35 PM EDT ----- Flagyl Rx for BV and Trichomonas

## 2021-07-10 NOTE — Telephone Encounter (Signed)
Attempted to reach patient regarding test results. No answer or voice mail to leave a message.Patient did review test results online. TOC in 4 weeks No sex for 7-14 days Partner can be treated at the health department.

## 2021-07-13 ENCOUNTER — Telehealth (INDEPENDENT_AMBULATORY_CARE_PROVIDER_SITE_OTHER): Payer: Medicaid Other | Admitting: Obstetrics & Gynecology

## 2021-07-13 ENCOUNTER — Encounter: Payer: Self-pay | Admitting: Obstetrics & Gynecology

## 2021-07-13 VITALS — BP 116/72 | HR 107 | Wt 185.0 lb

## 2021-07-13 DIAGNOSIS — Z8619 Personal history of other infectious and parasitic diseases: Secondary | ICD-10-CM

## 2021-07-13 DIAGNOSIS — Z3A37 37 weeks gestation of pregnancy: Secondary | ICD-10-CM

## 2021-07-13 DIAGNOSIS — O24410 Gestational diabetes mellitus in pregnancy, diet controlled: Secondary | ICD-10-CM

## 2021-07-13 DIAGNOSIS — A599 Trichomoniasis, unspecified: Secondary | ICD-10-CM

## 2021-07-13 DIAGNOSIS — Z148 Genetic carrier of other disease: Secondary | ICD-10-CM

## 2021-07-13 NOTE — Progress Notes (Signed)
    TELEHEALTH OBSTETRICS VISIT ENCOUNTER NOTE  Provider location: Center for [facility]   Patient location: Home  I connected with [facility] on 07/13/21 at  8:55 AM EDT by telephone at home and verified that I am speaking with the correct person using two identifiers. Of note, unable to do video encounter due to technical difficulties.    I discussed the limitations, risks, security and privacy concerns of performing an evaluation and management service by telephone and the availability of in person appointments. I also discussed with the patient that there may be a patient responsible charge related to this service. The patient expressed understanding and agreed to proceed.  Subjective:  Lorraine Burton is a 25 y.o. G3P1011 at [redacted]w[redacted]d being followed for ongoing prenatal care.  She is currently monitored for the following issues for this high-risk pregnancy and has Trichomonas infection; Supervision of other normal pregnancy, antepartum; Genetic carrier; and Gestational diabetes on their problem list.  Patient reports no complaints. Reports fetal movement. Denies any contractions, bleeding or leaking of fluid.   The following portions of the patient's history were reviewed and updated as appropriate: allergies, current medications, past family history, past medical history, past social history, past surgical history and problem list.   Objective:  Blood pressure 116/72, pulse (!) 107, weight 185 lb (83.9 kg), last menstrual period 10/21/2020, unknown if currently breastfeeding. General:  Alert, oriented and cooperative.   Mental Status: Normal mood and affect perceived. Normal judgment and thought content.  Rest of physical exam deferred due to type of encounter  Assessment and Plan:  Pregnancy: G3P1011 at [redacted]w[redacted]d 1. Diet controlled gestational diabetes mellitus (GDM) in third trimester FBS 90's and <100 PP  2. Genetic carrier   3. Trichomonas  infection treated  Term labor symptoms and general obstetric precautions including but not limited to vaginal bleeding, contractions, leaking of fluid and fetal movement were reviewed in detail with the patient.  I discussed the assessment and treatment plan with the patient. The patient was provided an opportunity to ask questions and all were answered. The patient agreed with the plan and demonstrated an understanding of the instructions. The patient was advised to call back or seek an in-person office evaluation/go to MAU at [facility] for any urgent or concerning symptoms. Please refer to After Visit Summary for other counseling recommendations.   I provided 12 minutes of non-face-to-face time during this encounter.  Return in about 1 week (around 07/20/2021) for possible Foley.  No future appointments.  Scheryl Darter, MD Center for [facility], [facility]

## 2021-07-13 NOTE — Progress Notes (Signed)
I connected with  [facility] on 07/13/21 by a video enabled telemedicine application and verified that I am speaking with the correct person using two identifiers.   I discussed the limitations of evaluation and management by telemedicine. The patient expressed understanding and agreed to proceed.   PATIENT : HOME PROVIDER: FEMINA  [facility], patient is having transportation issues, visit switched to Virtual.  She completed her medication and is no longer with her partner.

## 2021-07-17 ENCOUNTER — Other Ambulatory Visit: Payer: Self-pay | Admitting: Advanced Practice Midwife

## 2021-07-17 ENCOUNTER — Telehealth (HOSPITAL_COMMUNITY): Payer: Self-pay | Admitting: *Deleted

## 2021-07-17 NOTE — Telephone Encounter (Signed)
Preadmission screen  

## 2021-07-18 ENCOUNTER — Encounter (HOSPITAL_COMMUNITY): Payer: Self-pay | Admitting: Obstetrics & Gynecology

## 2021-07-20 ENCOUNTER — Ambulatory Visit (INDEPENDENT_AMBULATORY_CARE_PROVIDER_SITE_OTHER): Payer: Medicaid Other | Admitting: Obstetrics & Gynecology

## 2021-07-20 ENCOUNTER — Encounter: Payer: Medicaid Other | Admitting: Obstetrics

## 2021-07-20 ENCOUNTER — Other Ambulatory Visit: Payer: Self-pay

## 2021-07-20 DIAGNOSIS — Z3480 Encounter for supervision of other normal pregnancy, unspecified trimester: Secondary | ICD-10-CM

## 2021-07-20 DIAGNOSIS — O24410 Gestational diabetes mellitus in pregnancy, diet controlled: Secondary | ICD-10-CM

## 2021-07-20 NOTE — Progress Notes (Signed)
   PRENATAL VISIT NOTE  Subjective:  Lorraine Burton is a 25 y.o. G3P1011 at [redacted]w[redacted]d being seen today for ongoing prenatal care.  She is currently monitored for the following issues for this high-risk pregnancy and has Trichomonas infection; Supervision of other normal pregnancy, antepartum; Genetic carrier; and Gestational diabetes on their problem list.  Patient reports occasional contractions.   .  .   . Denies leaking of fluid.   The following portions of the patient's history were reviewed and updated as appropriate: allergies, current medications, past family history, past medical history, past social history, past surgical history and problem list.   Objective:  There were no vitals filed for this visit.  Fetal Status:        Presentation: Vertex  General:  Alert, oriented and cooperative. Patient is in no acute distress.  Skin: Skin is warm and dry. No rash noted.   Cardiovascular: Normal heart rate noted  Respiratory: Normal respiratory effort, no problems with respiration noted  Abdomen: Soft, gravid, appropriate for gestational age.        Pelvic: Cervical exam performed in the presence of a chaperone Dilation: 3 Effacement (%): 50 Station: -3  Extremities: Normal range of motion.     Mental Status: Normal mood and affect. Normal behavior. Normal judgment and thought content.   Assessment and Plan:  Pregnancy: G3P1011 at [redacted]w[redacted]d 1. Diet controlled gestational diabetes mellitus (GDM) in third trimester IOL 39 weeks per MFM  2. Supervision of other normal pregnancy, antepartum   Term labor symptoms and general obstetric precautions including but not limited to vaginal bleeding, contractions, leaking of fluid and fetal movement were reviewed in detail with the patient. Please refer to After Visit Summary for other counseling recommendations.   Return if symptoms worsen or fail to improve, for postpartum.  Future Appointments  Date Time Provider Department Center   07/21/2021  6:45 AM [facility] SCHED ROOM [facility] None    Scheryl Darter, MD

## 2021-07-21 ENCOUNTER — Inpatient Hospital Stay (HOSPITAL_COMMUNITY)
Admission: AD | Admit: 2021-07-21 | Discharge: 2021-07-23 | DRG: 806 | Disposition: A | Discharge status: Home / Self Care | Payer: Medicaid Other | Attending: Obstetrics & Gynecology | Admitting: Obstetrics & Gynecology

## 2021-07-21 ENCOUNTER — Inpatient Hospital Stay (HOSPITAL_COMMUNITY): Payer: Medicaid Other

## 2021-07-21 ENCOUNTER — Encounter (HOSPITAL_COMMUNITY): Payer: Self-pay | Admitting: Obstetrics & Gynecology

## 2021-07-21 DIAGNOSIS — O9832 Other infections with a predominantly sexual mode of transmission complicating childbirth: Secondary | ICD-10-CM | POA: Diagnosis present

## 2021-07-21 DIAGNOSIS — A599 Trichomoniasis, unspecified: Secondary | ICD-10-CM | POA: Diagnosis present

## 2021-07-21 DIAGNOSIS — Z3A39 39 weeks gestation of pregnancy: Secondary | ICD-10-CM | POA: Diagnosis not present

## 2021-07-21 DIAGNOSIS — O9902 Anemia complicating childbirth: Secondary | ICD-10-CM | POA: Diagnosis present

## 2021-07-21 DIAGNOSIS — O24410 Gestational diabetes mellitus in pregnancy, diet controlled: Secondary | ICD-10-CM | POA: Diagnosis present

## 2021-07-21 DIAGNOSIS — O24420 Gestational diabetes mellitus in childbirth, diet controlled: Secondary | ICD-10-CM | POA: Diagnosis present

## 2021-07-21 DIAGNOSIS — O99824 Streptococcus B carrier state complicating childbirth: Secondary | ICD-10-CM | POA: Diagnosis present

## 2021-07-21 DIAGNOSIS — Z148 Genetic carrier of other disease: Secondary | ICD-10-CM

## 2021-07-21 DIAGNOSIS — D509 Iron deficiency anemia, unspecified: Secondary | ICD-10-CM | POA: Diagnosis present

## 2021-07-21 DIAGNOSIS — A5901 Trichomonal vulvovaginitis: Secondary | ICD-10-CM | POA: Diagnosis present

## 2021-07-21 DIAGNOSIS — O24419 Gestational diabetes mellitus in pregnancy, unspecified control: Secondary | ICD-10-CM | POA: Diagnosis present

## 2021-07-21 LAB — CBC
HCT: 30.1 % — ABNORMAL LOW (ref 36.0–46.0)
Hemoglobin: 9 g/dL — ABNORMAL LOW (ref 12.0–15.0)
MCH: 26.8 pg (ref 26.0–34.0)
MCHC: 29.9 g/dL — ABNORMAL LOW (ref 30.0–36.0)
MCV: 89.6 fL (ref 80.0–100.0)
Platelets: 244 10*3/uL (ref 150–400)
RBC: 3.36 MIL/uL — ABNORMAL LOW (ref 3.87–5.11)
RDW: 15.8 % — ABNORMAL HIGH (ref 11.5–15.5)
WBC: 10.6 10*3/uL — ABNORMAL HIGH (ref 4.0–10.5)
nRBC: 0.2 % (ref 0.0–0.2)

## 2021-07-21 LAB — WET PREP, GENITAL
Clue Cells Wet Prep HPF POC: NONE SEEN
Sperm: NONE SEEN
Yeast Wet Prep HPF POC: NONE SEEN

## 2021-07-21 LAB — TYPE AND SCREEN
ABO/RH(D): O POS
Antibody Screen: NEGATIVE

## 2021-07-21 MED ORDER — LIDOCAINE HCL (PF) 1 % IJ SOLN
30.0000 mL | INTRAMUSCULAR | Status: AC | PRN
  Administered 2021-07-21: 30 mL via SUBCUTANEOUS
  Filled 2021-07-21: qty 30

## 2021-07-21 MED ORDER — TERBUTALINE SULFATE 1 MG/ML IJ SOLN: 0.2500 mg | Freq: Once | INTRAMUSCULAR | Status: DC | PRN

## 2021-07-21 MED ORDER — MISOPROSTOL 25 MCG QUARTER TABLET: 25.0000 ug | ORAL_TABLET | ORAL | Status: DC | PRN

## 2021-07-21 MED ORDER — PENICILLIN G POT IN DEXTROSE 60000 UNIT/ML IV SOLN: 3.0000 10*6.[IU] | INTRAVENOUS | Status: DC

## 2021-07-21 MED ORDER — LACTATED RINGERS IV SOLN: 500.0000 mL | Freq: Once | INTRAVENOUS | Status: DC

## 2021-07-21 MED ORDER — SOD CITRATE-CITRIC ACID 500-334 MG/5ML PO SOLN: 30.0000 mL | ORAL | Status: DC | PRN

## 2021-07-21 MED ORDER — PRENATAL MULTIVITAMIN CH
1.0000 | ORAL_TABLET | Freq: Every day | ORAL | Status: DC
  Administered 2021-07-22 – 2021-07-23 (×2): 1 via ORAL
  Filled 2021-07-21 (×2): qty 1

## 2021-07-21 MED ORDER — ACETAMINOPHEN 325 MG PO TABS: 650.0000 mg | ORAL_TABLET | ORAL | Status: DC | PRN

## 2021-07-21 MED ORDER — ZOLPIDEM TARTRATE 5 MG PO TABS: 5.0000 mg | ORAL_TABLET | Freq: Every evening | ORAL | Status: DC | PRN

## 2021-07-21 MED ORDER — WITCH HAZEL-GLYCERIN EX PADS: 1.0000 "application " | MEDICATED_PAD | CUTANEOUS | Status: DC | PRN

## 2021-07-21 MED ORDER — BENZOCAINE-MENTHOL 20-0.5 % EX AERO
1.0000 | INHALATION_SPRAY | CUTANEOUS | Status: DC | PRN
Start: 2021-07-21 — End: 2021-07-23
  Filled 2021-07-21: qty 56

## 2021-07-21 MED ORDER — OXYCODONE-ACETAMINOPHEN 5-325 MG PO TABS: 2.0000 | ORAL_TABLET | ORAL | Status: DC | PRN

## 2021-07-21 MED ORDER — FENTANYL-BUPIVACAINE-NACL 0.5-0.125-0.9 MG/250ML-% EP SOLN
12.0000 mL/h | EPIDURAL | Status: DC | PRN
  Filled 2021-07-21: qty 250

## 2021-07-21 MED ORDER — DIPHENHYDRAMINE HCL 25 MG PO CAPS: 25.0000 mg | ORAL_CAPSULE | Freq: Four times a day (QID) | ORAL | Status: DC | PRN

## 2021-07-21 MED ORDER — IBUPROFEN 600 MG PO TABS
600.0000 mg | ORAL_TABLET | Freq: Four times a day (QID) | ORAL | Status: DC
  Administered 2021-07-21 – 2021-07-23 (×6): 600 mg via ORAL
  Filled 2021-07-21 (×8): qty 1

## 2021-07-21 MED ORDER — PHENYLEPHRINE 40 MCG/ML (10ML) SYRINGE FOR IV PUSH (FOR BLOOD PRESSURE SUPPORT): 80.0000 ug | PREFILLED_SYRINGE | INTRAVENOUS | Status: DC | PRN

## 2021-07-21 MED ORDER — DIPHENHYDRAMINE HCL 50 MG/ML IJ SOLN: 12.5000 mg | INTRAMUSCULAR | Status: DC | PRN

## 2021-07-21 MED ORDER — ONDANSETRON HCL 4 MG/2ML IJ SOLN
4.0000 mg | Freq: Four times a day (QID) | INTRAMUSCULAR | Status: DC | PRN
  Administered 2021-07-21: 4 mg via INTRAVENOUS
  Filled 2021-07-21: qty 2

## 2021-07-21 MED ORDER — OXYTOCIN BOLUS FROM INFUSION
333.0000 mL | Freq: Once | INTRAVENOUS | Status: AC
  Administered 2021-07-21: 333 mL via INTRAVENOUS

## 2021-07-21 MED ORDER — SENNOSIDES-DOCUSATE SODIUM 8.6-50 MG PO TABS
2.0000 | ORAL_TABLET | Freq: Every day | ORAL | Status: DC
  Administered 2021-07-22 – 2021-07-23 (×2): 2 via ORAL
  Filled 2021-07-21 (×2): qty 2

## 2021-07-21 MED ORDER — OXYTOCIN-SODIUM CHLORIDE 30-0.9 UT/500ML-% IV SOLN
1.0000 m[IU]/min | INTRAVENOUS | Status: DC
  Administered 2021-07-21: 2 m[IU]/min via INTRAVENOUS

## 2021-07-21 MED ORDER — ONDANSETRON HCL 4 MG PO TABS: 4.0000 mg | ORAL_TABLET | ORAL | Status: DC | PRN

## 2021-07-21 MED ORDER — LACTATED RINGERS IV SOLN: 500.0000 mL | INTRAVENOUS | Status: DC | PRN

## 2021-07-21 MED ORDER — FENTANYL CITRATE (PF) 100 MCG/2ML IJ SOLN
100.0000 ug | INTRAMUSCULAR | Status: DC | PRN
  Administered 2021-07-21 (×2): 100 ug via INTRAVENOUS
  Filled 2021-07-21 (×2): qty 2

## 2021-07-21 MED ORDER — OXYCODONE-ACETAMINOPHEN 5-325 MG PO TABS: 1.0000 | ORAL_TABLET | ORAL | Status: DC | PRN

## 2021-07-21 MED ORDER — LACTATED RINGERS IV SOLN
INTRAVENOUS | Status: DC
  Administered 2021-07-21: 1000 mL via INTRAVENOUS

## 2021-07-21 MED ORDER — OXYTOCIN-SODIUM CHLORIDE 30-0.9 UT/500ML-% IV SOLN
2.5000 [IU]/h | INTRAVENOUS | Status: DC
  Administered 2021-07-21: 2.5 [IU]/h via INTRAVENOUS
  Filled 2021-07-21: qty 500

## 2021-07-21 MED ORDER — DIBUCAINE (PERIANAL) 1 % EX OINT: 1.0000 "application " | TOPICAL_OINTMENT | CUTANEOUS | Status: DC | PRN

## 2021-07-21 MED ORDER — OXYTOCIN-SODIUM CHLORIDE 30-0.9 UT/500ML-% IV SOLN: 1.0000 m[IU]/min | INTRAVENOUS | Status: DC

## 2021-07-21 MED ORDER — EPHEDRINE 5 MG/ML INJ: 10.0000 mg | INTRAVENOUS | Status: DC | PRN

## 2021-07-21 MED ORDER — ONDANSETRON HCL 4 MG/2ML IJ SOLN: 4.0000 mg | INTRAMUSCULAR | Status: DC | PRN

## 2021-07-21 MED ORDER — TETANUS-DIPHTH-ACELL PERTUSSIS 5-2.5-18.5 LF-MCG/0.5 IM SUSY: 0.5000 mL | PREFILLED_SYRINGE | Freq: Once | INTRAMUSCULAR | Status: DC

## 2021-07-21 MED ORDER — COCONUT OIL OIL: 1.0000 "application " | TOPICAL_OIL | Status: DC | PRN

## 2021-07-21 MED ORDER — SODIUM CHLORIDE 0.9 % IV SOLN
5.0000 10*6.[IU] | Freq: Once | INTRAVENOUS | Status: AC
  Administered 2021-07-21: 5 10*6.[IU] via INTRAVENOUS
  Filled 2021-07-21: qty 5

## 2021-07-21 MED ORDER — SIMETHICONE 80 MG PO CHEW: 80.0000 mg | CHEWABLE_TABLET | ORAL | Status: DC | PRN

## 2021-07-21 NOTE — Lactation Note (Signed)
This note was copied from a baby's chart. Lactation Consultation Note  Patient Name: Lorraine Burton [place] Today's Date: 07/21/2021 Reason for consult: L&D Initial assessment;Maternal endocrine disorder Age:27 hours  L&D consult with <60 minutes old infant and P2 mother. Congratulated family on newborn.  Offered assistance with latch, laid back position. Infant latched successfully  Discussed STS as ideal transition for infants after birth. Talked about primal reflexes. Explained LC services availability during postpartum stay. Thanked family for their time.    Maternal Data Has patient been taught Hand Expression?: Yes Does the patient have breastfeeding experience prior to this delivery?: Yes How long did the patient breastfeed?: 1 week  Feeding Mother's Current Feeding Choice: Breast Milk  LATCH Score Latch: Grasps breast easily, tongue down, lips flanged, rhythmical sucking.  Audible Swallowing: A few with stimulation  Type of Nipple: Everted at rest and after stimulation  Comfort (Breast/Nipple): Soft / non-tender  Hold (Positioning): Assistance needed to correctly position infant at breast and maintain latch.  LATCH Score: 8  Interventions Interventions: Breast feeding basics reviewed;Skin to skin;Assisted with latch;Hand express;Education  Discharge Pump: Personal WIC Program: Yes  Consult Status Consult Status: Follow-up from L&D    Arjan Strohm A Higuera Ancidey 07/21/2021, 4:02 PM

## 2021-07-21 NOTE — Discharge Summary (Addendum)
Postpartum Discharge Summary   Patient Name: Lorraine Burton DOB: 1995/11/17 MRN: [id number]  Date of admission: 07/21/2021 Delivery date:07/21/2021  Delivering provider: Laury Deep  Date of discharge: 07/23/2021  Admitting diagnosis: Gestational diabetes mellitus, class A1 [O24.410] Intrauterine pregnancy: 103w0d    Secondary diagnosis:  Active Problems:   Trichomonas infection   Genetic carrier   Gestational diabetes   Gestational diabetes mellitus, class A1   Postpartum care following vaginal delivery  Additional problems: (+) trichomonas 10/14 >>needs TOC    Discharge diagnosis: Term Pregnancy Delivered and GDM A1                                              Post partum procedures: None Augmentation: Pitocin Complications: None  Hospital course: Induction of Labor With Vaginal Delivery   25y.o. yo G3P1011 at 349w0das admitted to the hospital 07/21/2021 for induction of labor.  Indication for induction: A1 DM.  Patient had an uncomplicated labor course as follows: Membrane Rupture Time/Date: 3:05 PM ,07/21/2021   Delivery Method:Vaginal, Spontaneous  Episiotomy:   Lacerations:  Vaginal;2nd degree  Details of delivery can be found in separate delivery note.  Patient had a routine postpartum course. Patient is discharged home 07/23/21.  Newborn Data: Birth date:07/21/2021  Birth time:3:07 PM  Gender:Female  Living status:Living  Apgars:9 ,9  Weight:3040 g   Magnesium Sulfate received: No BMZ received: No Rhophylac:N/A MMR:N/A T-DaP: declined Flu: No Transfusion:No  Physical exam  Vitals:   07/22/21 0916 07/22/21 1429 07/22/21 2133 07/23/21 0545  BP: 100/72 108/71 112/80 103/72  Pulse: 73 65 (!) 59 (!) 57  Resp: 17 15 18 16   Temp: 98.5 F (36.9 C) 98.4 F (36.9 C) 98.5 F (36.9 C) 98.3 F (36.8 C)  TempSrc: Oral Oral Oral Oral  SpO2: 99% 98% 100% 100%   General: alert, cooperative, and no distress Lochia: appropriate Uterine Fundus:  firm Incision: N/A DVT Evaluation: No evidence of DVT seen on physical exam. Labs: Lab Results  Component Value Date   WBC 10.6 (H) 07/21/2021   HGB 9.0 (L) 07/21/2021   HCT 30.1 (L) 07/21/2021   MCV 89.6 07/21/2021   PLT 244 07/21/2021   No flowsheet data found. Edinburgh Score: No flowsheet data found.   After visit meds:  Allergies as of 07/23/2021   No Known Allergies      Medication List     STOP taking these medications    Accu-Chek Guide test strip Generic drug: glucose blood   Accu-Chek Softclix Lancets lancets   Blood Pressure Kit Devi   Gojji Weight Scale Misc   metoCLOPramide 10 MG tablet Commonly known as: REGLAN   prenatal multivitamin Tabs tablet       TAKE these medications    acetaminophen 325 MG tablet Commonly known as: Tylenol Take 2 tablets (650 mg total) by mouth every 4 (four) hours as needed (for pain scale < 4).   ferrous sulfate 325 (65 FE) MG tablet Take 1 tablet (325 mg total) by mouth every other day. Start taking on: July 24, 2021   ibuprofen 600 MG tablet Commonly known as: ADVIL Take 1 tablet (600 mg total) by mouth every 6 (six) hours.         Discharge home in stable condition Infant Feeding: Breast Infant Disposition:home with mother Discharge instruction: per After Visit Summary and Postpartum booklet. Activity: Advance  as tolerated. Pelvic rest for 6 weeks.  Diet: routine diet Future Appointments: Future Appointments  Date Time Provider [place]  09/03/2021  9:00 AM [facility] None  09/03/2021  9:15 AM [place]-Kirby, Kathie Dike, CNM [facility] None   Follow up Visit:   Message sent to [facility] by R. Renato Battles, CNM on 07/21/2021 Please schedule this patient for a In person postpartum visit in 4 weeks with the following provider: Any provider. Additional Postpartum F/U:2 hour GTT  High risk pregnancy complicated by: GDM Delivery mode:  Vaginal, Spontaneous  Anticipated Birth Control:   Unsure, she is aware of her options.   Needs TOC for trichomonas during postpartum visit.   Wells Guiles, DO 07/23/2021, 9:00 AM PGY-1, [facility]  GME ATTESTATION:  I saw and evaluated the patient. I agree with the findings and the plan of care as documented in the resident's note.  Darrelyn Hillock, DO OB Fellow, [place] [facility] [place] 07/23/2021 9:32 AM

## 2021-07-21 NOTE — Lactation Note (Addendum)
This note was copied from a baby's chart. Lactation Consultation Note  Patient Name: Lorraine Burton [place] Today's Date: 07/21/2021 Reason for consult: Initial assessment;Term Age:27 hours Per mom, infant breastfeed for 10 minutes in L&D. LC entered the room, infant was having lab work done and started cuing to breastfeed. Mom latched infant on her right breast using the football hold position, infant latched with depth, sustaining latch and was still breastfeeding after 16 minutes when LC left the room.  Mom made aware of O/P services, breastfeeding support groups, community resources, and our phone # for post-discharge questions.   Mom's plan: 1-Mom will breastfeed infant according to feeding cues, 8 to 12+ or more times within 24 hours, skin to skin. 2- Mom will attempt to latch infant on both breast during a feeding. 3- Mom knows to call RN/LC for further latch assistance if needed.  Maternal Data Has patient been taught Hand Expression?: Yes Does the patient have breastfeeding experience prior to this delivery?: Yes How long did the patient breastfeed?: Per mom, her 1st child did not latch well and she stopped breastfeeding after 1 week due to latch difficulties.  Feeding Mother's Current Feeding Choice: Breast Milk  LATCH Score Latch: Grasps breast easily, tongue down, lips flanged, rhythmical sucking.  Audible Swallowing: Spontaneous and intermittent  Type of Nipple: Everted at rest and after stimulation  Comfort (Breast/Nipple): Soft / non-tender  Hold (Positioning): Assistance needed to correctly position infant at breast and maintain latch.  LATCH Score: 9   Lactation Tools Discussed/Used    Interventions Interventions: Breast feeding basics reviewed;Assisted with latch;Skin to skin;Breast compression;Adjust position;Support pillows;Position options;Education;LC Services brochure  Discharge Pump: Personal (Per mom, she has DEBP at home.) [facility] Program:  Yes  Consult Status Consult Status: Follow-up Date: 07/22/21 Follow-up type: In-patient    Danelle Earthly 07/21/2021, 8:12 PM

## 2021-07-21 NOTE — H&P (Signed)
OBSTETRIC ADMISSION HISTORY AND PHYSICAL  Shervon Kerwin is a 25 y.o. female G3P1011 with IUP at 108w0dby LMP [facility] She reports +FMs, no LOF, no VB, no blurry vision, headaches, peripheral edema, or RUQ pain.  She plans on breast feeding. She is undecided about birth control postpartum.   [facility]    Dating: By LMP --->  Estimated Date of Delivery: 07/28/21  Sono:   _0 , normal anatomy, cephalic presentation, anterior placental lie, 2442g, 17% EFW  Prenatal History/Complications:  GDMA1 Increased risk for SMA carrier  Trichomonal infection (positive 10/13 and treated, needs TOC)  Past Medical History: Past Medical History:  Diagnosis Date   Gestational diabetes    Trichimoniasis 07/06/2021   needs TOC    Past Surgical History: Past Surgical History:  Procedure Laterality Date   NO PAST SURGERIES      Obstetrical History: OB History     Gravida  3   Para  1   Term  1   Preterm      AB  1   Living  1      SAB  1   IAB      Ectopic      Multiple      Live Births  1           Social History Social History   Socioeconomic History   Marital status: Single    Spouse name: Not on file   Number of children: Not on file   Years of education: Not on file   Highest education level: Not on file  Occupational History   Not on file  Tobacco Use   Smoking status: Never   Smokeless tobacco: Never  Vaping Use   Vaping Use: Never used  Substance and Sexual Activity   Alcohol use: No   Drug use: No   Sexual activity: Yes    Birth control/protection: None  Other Topics Concern   Not on file  Social History Narrative   Not on file   Social Determinants of Health   Financial Resource Strain: Not on file  Food Insecurity: Not on file  Transportation Needs: Not on file  Physical Activity: Not on file  Stress: Not on file  Social Connections: Not on file    Family History: Family  History  Problem Relation Age of Onset   Hypertension Mother     Allergies: No Known Allergies  Medications Prior to Admission  Medication Sig Dispense Refill Last Dose   metoCLOPramide (REGLAN) 10 MG tablet Take 1 tablet (10 mg total) by mouth 4 (four) times daily as needed for nausea or vomiting (Headache). 30 tablet 2 Past Week   Prenatal Vit-Fe Fumarate-FA (PRENATAL MULTIVITAMIN) TABS tablet Take 1 tablet by mouth daily at 12 noon.   07/20/2021   Accu-Chek Softclix Lancets lancets 1 each by Other route 4 (four) times daily. 100 each 12    Blood Pressure Monitoring (BLOOD PRESSURE KIT) DEVI 1 Device by Does not apply route as needed. 1 each 0    glucose blood (ACCU-CHEK GUIDE) test strip Use to check blood sugars four times a day was instructed (Patient not taking: Reported on 07/20/2021) 100 each 12    Misc. Devices (GOJJI WEIGHT SCALE) MISC 1 Device by Does not apply route as needed. 1 each 0      Review of Systems  All systems reviewed and negative except as stated in HPI  Blood pressure (Marland Kitchen  103/55, pulse 69, temperature 98.4 F (36.9 C), temperature source Oral, resp. rate 16, last menstrual period 10/21/2020, unknown if currently breastfeeding.  General appearance: alert, cooperative, and no distress Lungs: normal work of breathing on room air  Heart: normal rate, warm and well perfused  Abdomen: soft, non-tender, gravid  Extremities: no LE edema or calf tenderness to palpation   Presentation: Cephalic per RN Fetal monitoring: Baseline 120 bpm, moderate variability, + accels, no decels  Uterine activity: Every 4-5 minutes  SVE: 3.5/60/-3 (M.Aube, RN)  Prenatal labs: ABO, Rh: --/--/O POS (10/29 1216) Antibody: NEG (10/29 1216) Rubella: 9.57 (06/01 1432) RPR: Non Reactive (08/09 1147)  HBsAg: Negative (06/01 1432)  HIV: Non Reactive (08/09 1147)  GBS: Positive/-- (10/13 1636)  2 hr Glucola abnormal  Genetic screening - Increased carrier risk for SMA, LR NIPS, AFP  neg Anatomy US normal  Prenatal Transfer Tool  Maternal Diabetes: Yes:  Diabetes Type:  Diet controlled Genetic Screening: Increased carrier risk for SMA, LR NIPS, AFP neg Maternal Ultrasounds/Referrals: Normal Fetal Ultrasounds or other Referrals:  None Maternal Substance Abuse:  No Significant Maternal Medications:  None Significant Maternal Lab Results: Group B Strep positive  Results for orders placed or performed during the hospital encounter of 07/21/21 (from the past 24 hour(s))  Type and screen   Collection Time: 07/21/21 12:16 PM  Result Value Ref Range   ABO/RH(D) PENDING    Antibody Screen PENDING    Sample Expiration      07/24/2021,2359 Performed at [facility], [address], [place], [place] [address]     Patient Active Problem List   Diagnosis Date Noted   Gestational diabetes mellitus, class A1 07/21/2021   Gestational diabetes 05/02/2021   Genetic carrier 03/21/2021   Supervision of other normal pregnancy, antepartum 02/21/2021   Trichomonas infection 05/18/2020    Assessment/Plan:  Kloi Brodman is a 25 y.o. G3P1011 at 37w0dhere for IOL due to [place]  #Labor: Favorable exam. Will start Pitocin 2x2 and reassess in 4 hours.  #Pain: PRN #FWB: Cat 1  #ID:  GBS pos > PCN #MOF: Breast #MOC: Undecided  #Circ:  Desires   #A1GDM: Plan for fasting CBG postpartum.  #Trichomonal infection: Positive 10/13 and prescription sent in on 10/15. Patient confirms that she has taken her antibiotics. Will reassess via wet prep on admission.  CGenia Del MD  07/21/2021, 12:47 PM

## 2021-07-22 LAB — RPR: RPR Ser Ql: NONREACTIVE

## 2021-07-22 MED ORDER — FERROUS SULFATE 325 (65 FE) MG PO TABS
325.0000 mg | ORAL_TABLET | ORAL | Status: DC
  Administered 2021-07-22: 325 mg via ORAL
  Filled 2021-07-22: qty 1

## 2021-07-22 NOTE — Progress Notes (Signed)
POSTPARTUM PROGRESS NOTE  Post Partum Day 1  Subjective:  Lorraine Burton is a 25 y.o. [id number] s/p VD at [redacted]w[redacted]d.  She reports she is doing well. No acute events overnight. She denies any problems with ambulating, voiding or po intake. Denies nausea or vomiting.  Pain is well controlled.  Lochia is mild.  Objective: Blood pressure (!) 106/53, pulse (!) 57, temperature 98.5 F (36.9 C), temperature source Oral, resp. rate 15, last menstrual period 10/21/2020, SpO2 100 %, unknown if currently breastfeeding.  Physical Exam:  General: alert, cooperative and no distress Chest: no respiratory distress Heart:regular rate, distal pulses intact Uterine Fundus: firm, appropriately tender DVT Evaluation: No calf swelling or tenderness Extremities: minimal edema Skin: warm, dry  Recent Labs    07/21/21 1213  HGB 9.0*  HCT 30.1*    Assessment/Plan: Lorraine Burton is a 25 y.o. [id number] s/p VD at [redacted]w[redacted]d   PPD#1 - Doing well  Routine postpartum care  Anemia of pregnancy, iron deficiency anemia: Asymptomatic. Start ferrous sulfate every other day.   Contraception: Undecided, aware of options.  Feeding: Breastfeeding Dispo: Plan for discharge tomorrow.   LOS: 1 day   Leticia Penna, DO  OB Fellow  07/22/2021, 7:26 AM

## 2021-07-22 NOTE — Lactation Note (Signed)
This note was copied from a baby's chart. Lactation Consultation Note  Patient Name: Lorraine Burton IOMBT'D Date: 07/22/2021 Reason for consult: Follow-up assessment;Mother's request;Term;Hyperbilirubinemia;Maternal endocrine disorder Age:27 hours  Mom states latch non painful, nipples are round and no compression noted.  LC encouraged mom to due feedings STS with infant out of T shirt and swaddle and to compress breast in midst of feeding to offer more volume.  We also talked about spoon feeding after latching if infant still cueing.  Infant being monitored for bilirubin, last stool transition to brown good sign.  BF supplementation guide provided and reviewed.  All questions answered at the end of the visit.   Maternal Data Has patient been taught Hand Expression?: Yes  Feeding Mother's Current Feeding Choice: Breast Milk  LATCH Score Latch: Repeated attempts needed to sustain latch, nipple held in mouth throughout feeding, stimulation needed to elicit sucking reflex.  Audible Swallowing: Spontaneous and intermittent  Type of Nipple: Everted at rest and after stimulation (nipples are short shafted but erect no signs of compression stripe)  Comfort (Breast/Nipple): Soft / non-tender  Hold (Positioning): Assistance needed to correctly position infant at breast and maintain latch.  LATCH Score: 8   Lactation Tools Discussed/Used    Interventions Interventions: Breast feeding basics reviewed;Support pillows;Education;Assisted with latch;Position options;Skin to skin;Expressed milk;Breast massage;Hand express;Infant Driven Feeding Algorithm education;Breast compression;Adjust position  Discharge    Consult Status Consult Status: Follow-up Date: 07/23/21 Follow-up type: In-patient    [name]  Nicholson-Springer 07/22/2021, 7:23 PM

## 2021-07-23 MED ORDER — ACETAMINOPHEN 325 MG PO TABS: 650.0000 mg | ORAL_TABLET | ORAL | 0 refills | Status: DC | PRN

## 2021-07-23 MED ORDER — FERROUS SULFATE 325 (65 FE) MG PO TABS: 325.0000 mg | ORAL_TABLET | ORAL | 0 refills | Status: DC

## 2021-07-23 MED ORDER — IBUPROFEN 600 MG PO TABS
600.0000 mg | ORAL_TABLET | Freq: Four times a day (QID) | ORAL | 0 refills | Status: DC
Start: 2021-07-23 — End: 2021-12-18

## 2021-07-23 NOTE — Progress Notes (Signed)
CSW met with MOB at MOB's bedside in room 406.  When CSW arrived, MOB was bonding with infant as evidence by holding infant and engaging in infant massages. CSW explained CSW's role and invited MOB to ask questions during the clinical assessment; MOB agreed.  MOB was polite, easy to engage, and was receptive to speaking with CSW.    CSW asked about MOB's MH hx and MOB denied a hx of anx/dep.  However, MOB reported feeling "A little anxious" during the pregnancy.  CSW asked about MOB's current thoughts and emotions, and MOB stated, "I feel pretty good."  Per MOB, she feels attached and bonded with infant and she denied any current symptoms of anxiety. MOB was open to resources and supports if symptoms present during the postpartum period.  CSW provided education regarding the baby blues period vs. perinatal mood disorders, discussed treatment and gave resources for mental health follow up if concerns arise.  CSW recommends self-evaluation during the postpartum time period using the New Mom Checklist from Postpartum Progress and encouraged MOB to contact a medical professional if symptoms are noted at any time.  MOB presented with insight and awareness and did not demonstrate any acute MH symptoms. MOB reports having a good support team that primarily consist of MOB's mother and immediate family. Per MOB, MOB feels comfortable seeking help if needed.  MOB accepted resources for outpatient counseling. CSW assessed for safety and MOB denied SI, HI, and DV.     CSW identifies no further need for intervention and no barriers to discharge at this time.   Lorraine Burton, MSW, LCSW Clinical Social Work [phone] 

## 2021-07-23 NOTE — Lactation Note (Signed)
This note was copied from a baby's chart. Lactation Consultation Note  Patient Name: Lorraine Burton FSFSE'L Date: 07/23/2021 Reason for consult: Follow-up assessment;Mother's request;Term;Maternal endocrine disorder;Breastfeeding assistance Age:27 hours  Infant multiple urine today after each feeding, last stool 12 hrs ago brown in color. LC worked with latching prior to discharge. Mom to latch in football with compression to offer more volume prevent infant falling asleep.  Mom electric pump at home. She will post pumping to offer more volume. DEPB q 3hrs for .BF supplementation guide provided. Mom to reach out to [facility] for further support.  Pace bottle feeding and yellow slow flow nipple provided.  All questions answered at the end of the visit.   Maternal Data Has patient been taught Hand Expression?: Yes  Feeding Mother's Current Feeding Choice: Breast Milk  LATCH Score Latch: Repeated attempts needed to sustain latch, nipple held in mouth throughout feeding, stimulation needed to elicit sucking reflex.  Audible Swallowing: Spontaneous and intermittent  Type of Nipple: Everted at rest and after stimulation  Comfort (Breast/Nipple): Soft / non-tender  Hold (Positioning): Assistance needed to correctly position infant at breast and maintain latch.  LATCH Score: 8   Lactation Tools Discussed/Used Tools: Pump;Flanges Flange Size: 24 Breast pump type: Manual (Mom to use personal electric pump at home) Pump Education: Setup, frequency, and cleaning;Milk Storage Reason for Pumping: increase stimulation Pumping frequency: every 3 hrs for  Interventions Interventions: Breast feeding basics reviewed;Support pillows;Education;Assisted with latch;Position options;Pace feeding;Skin to skin;Expressed [employer];Infant Driven Feeding Algorithm education;Hand express;Breast compression;Adjust position;Hand pump;Pre-pump if  needed  Discharge Discharge Education: Engorgement and breast care;Warning signs for feeding baby Pump: Personal WIC Program: Yes  Consult Status Consult Status: Follow-up Date: 07/24/21 Follow-up type: In-patient    Lorraine Feehan  Burton 07/23/2021, 3:34 PM

## 2021-08-02 ENCOUNTER — Telehealth (HOSPITAL_COMMUNITY): Payer: Self-pay | Admitting: *Deleted

## 2021-08-02 NOTE — Telephone Encounter (Signed)
Attempted hospital discharge follow-up call. No answer received. Deforest Hoyles, RN, 08/02/21, [phone].

## 2021-08-23 ENCOUNTER — Encounter: Payer: Self-pay | Admitting: Obstetrics & Gynecology

## 2021-09-03 ENCOUNTER — Ambulatory Visit: Payer: Medicaid Other | Admitting: Advanced Practice Midwife

## 2021-09-03 ENCOUNTER — Other Ambulatory Visit: Payer: Medicaid Other

## 2021-09-23 NOTE — L&D Delivery Note (Signed)
OB/GYN Faculty Practice Delivery Note  Lorraine Burton is a 26 y.o. [id number] s/p SVD at [redacted]w[redacted]d. She was admitted for PTL.   ROM: 0h 67m with clear fluid GBS Status: Pending; inadequately treated Maximum Maternal Temperature: 99.0  Labor Progress: Initial SVE: 4/90/-2. She then progressed to complete.   Delivery Date/Time: 9/7 @1004  Delivery: Called to room and patient was complete and pushing. Head delivered ROA. No nuchal cord present x1 reduced at perineum. Shoulder and body delivered in usual fashion. Infant with spontaneous cry, placed on mother's abdomen, dried and stimulated. Cord clamped x 2 after 1-minute delay, and cut by provider and handed to NICU team to assess. Able to do skin to skin prior to going to NICU for breathing support. Cord blood drawn. Placenta delivered spontaneously with gentle cord traction. Fundus firm with massage and Pitocin. Labia, perineum, vagina, and cervix inspected and found to have a 2nd degree perineal laceration.  Baby Weight: pending  Placenta: 3 vessel, intact. Sent to path. Marginal insertion v. Vilamentous cord w/ PTL Complications: None Lacerations: 2nd degree perineal  EBL: 120 mL Analgesia: Maternally supported. IV fentanyl and local lidocaine for repair.    Infant:  APGAR (1 MIN): 7   APGAR (5 MINS): 9    [name], DO OB Fellow, Faculty Practice , [facility] [employer] 05/30/2022, 10:53 AM

## 2021-12-07 IMAGING — US US OB < 14 WEEKS - US OB TV
1 series · 15 of 28 positions shown · non-contrast
Comparison: None.

CLINICAL DATA: Vaginal bleeding.

EXAM:
OBSTETRIC <14 WK US AND TRANSVAGINAL OB US
TECHNIQUE: Both transabdominal and transvaginal ultrasound examinations were
performed for complete evaluation of the gestation as well as the
maternal uterus, adnexal regions, and pelvic cul-de-sac.
Transvaginal technique was performed to assess early pregnancy.

[Series 1: us ob < 14 weeks - us ob tv · 57 acquisitions, 15 frames shown]
[im 1/57]
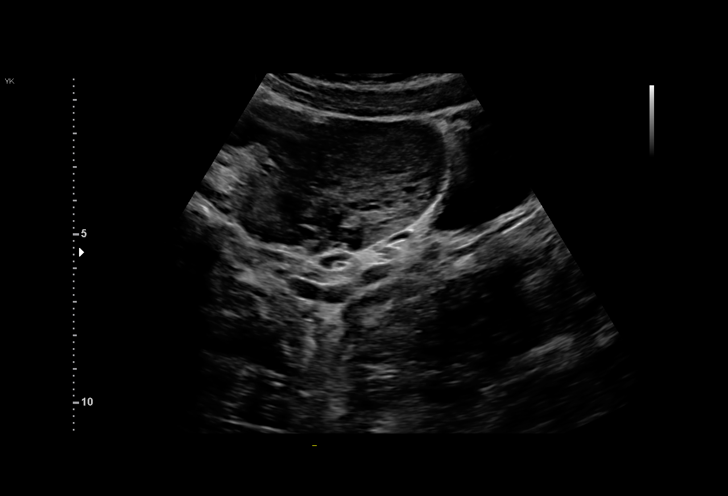
[im 5/57]
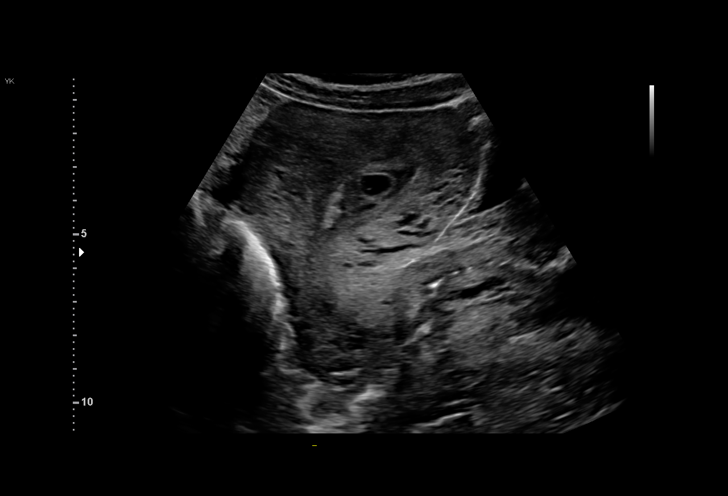
[im 9/57]
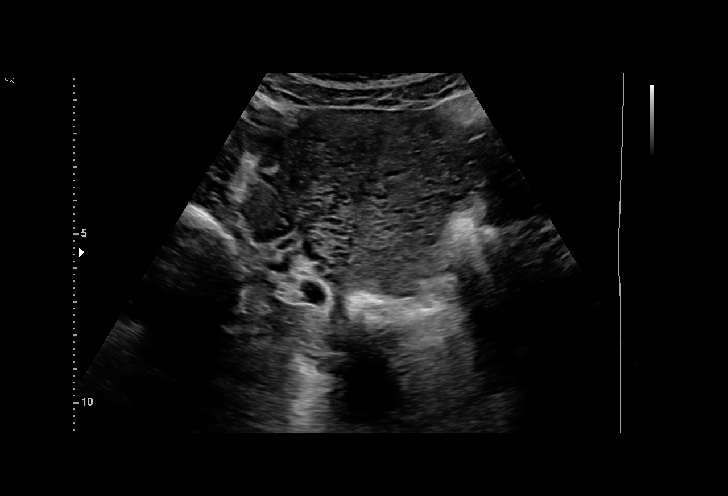
[im 13/57]
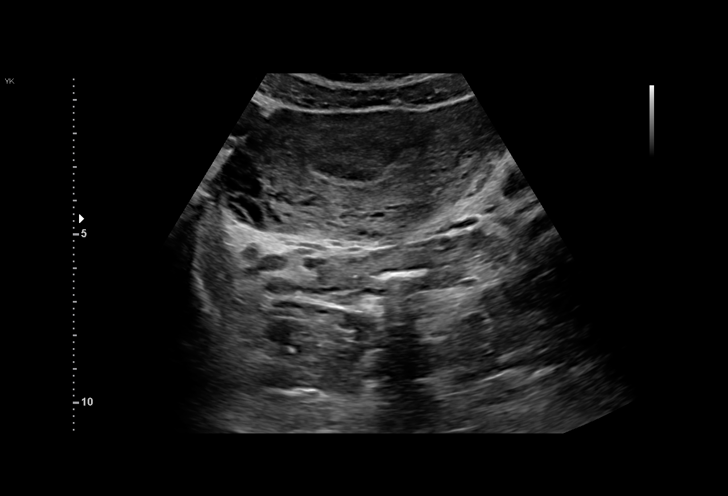
[im 17/57]
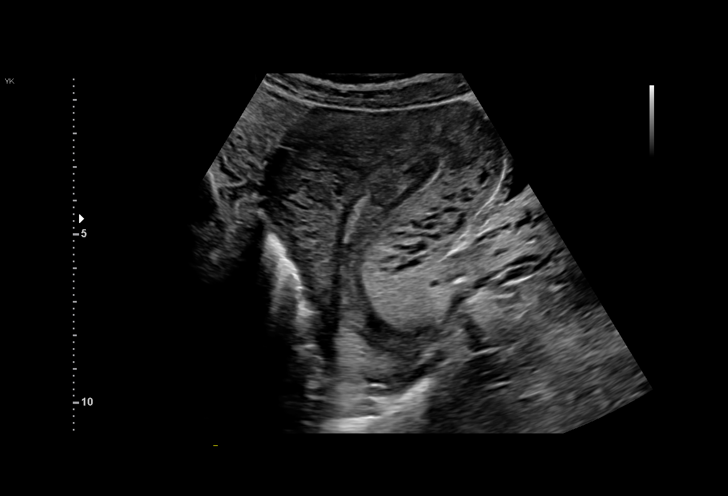
[im 21/57]
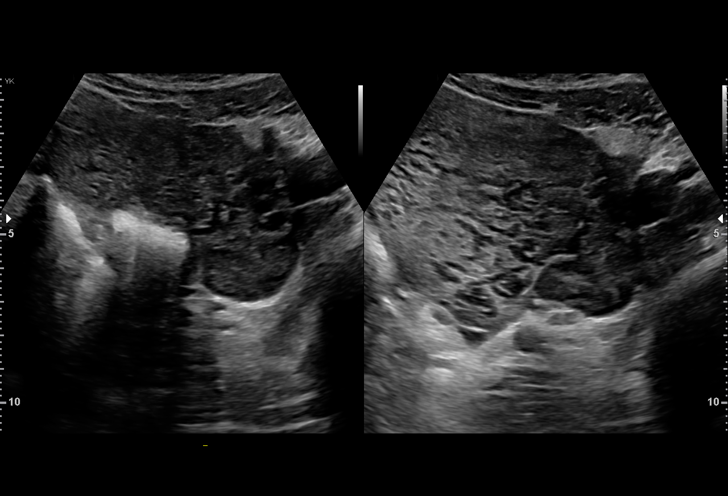
[im 25/57]
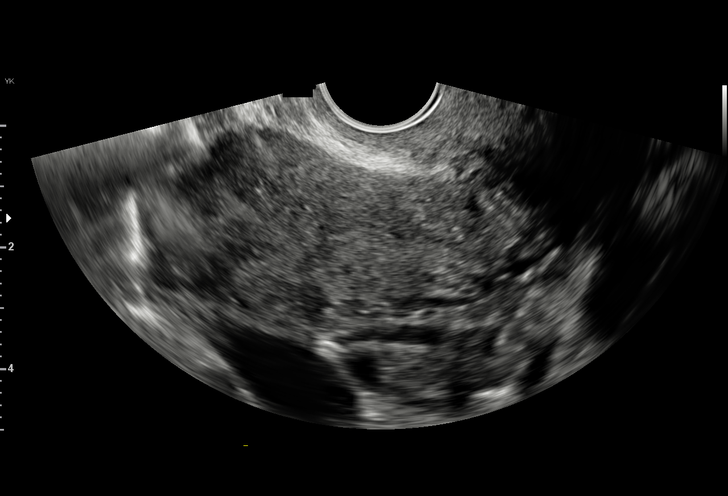
[im 30/57]
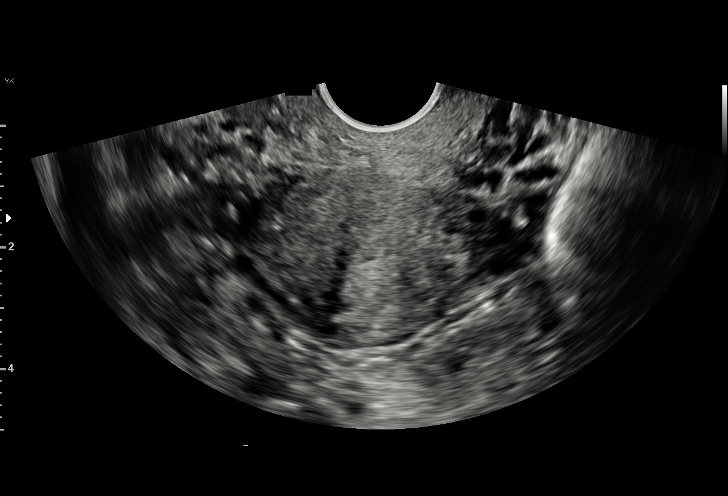
[im 32/57]
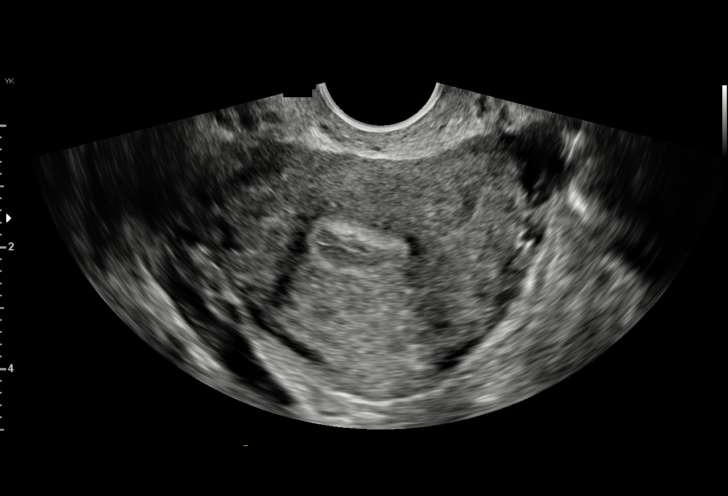
[im 36/57]
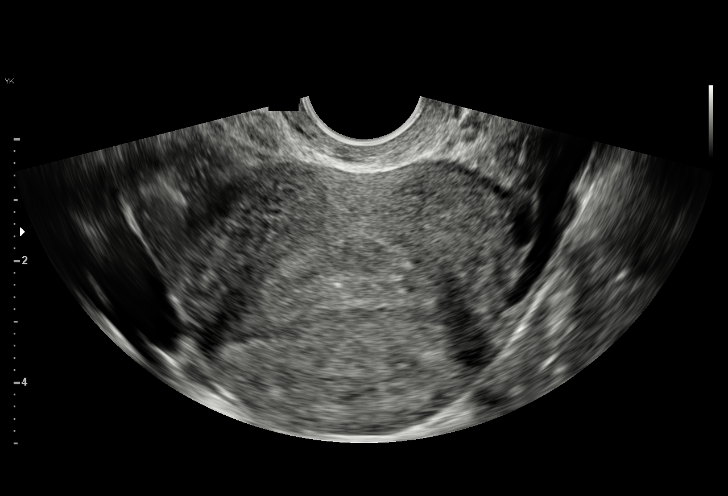
[im 40/57]
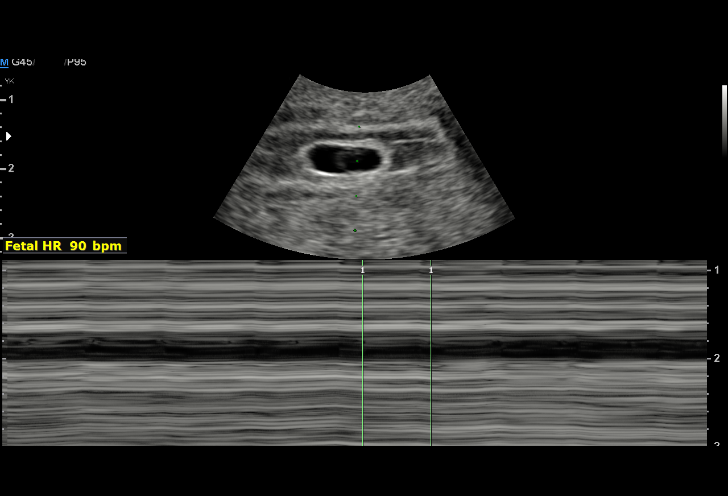
[im 44/57]
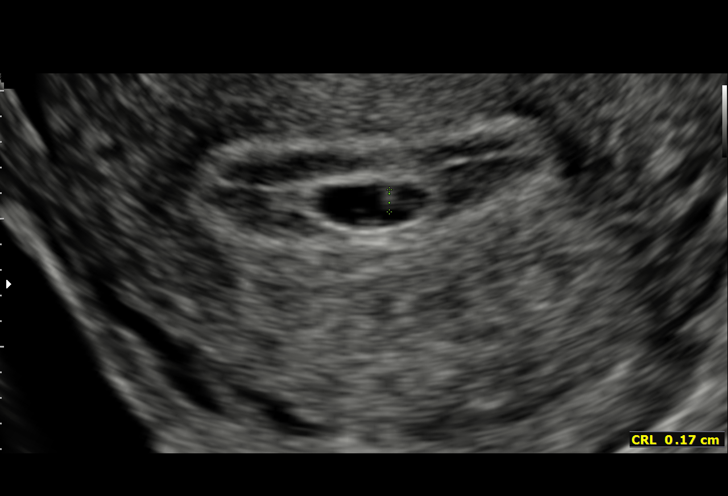
[im 48/57]
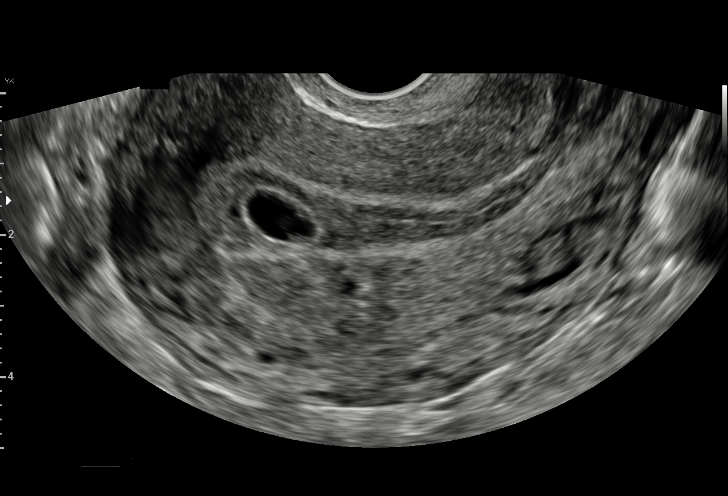
[im 52/57]
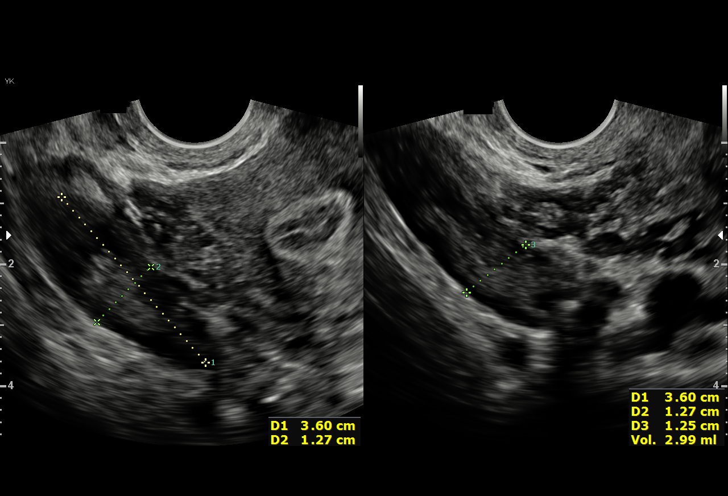
[im 57/57]
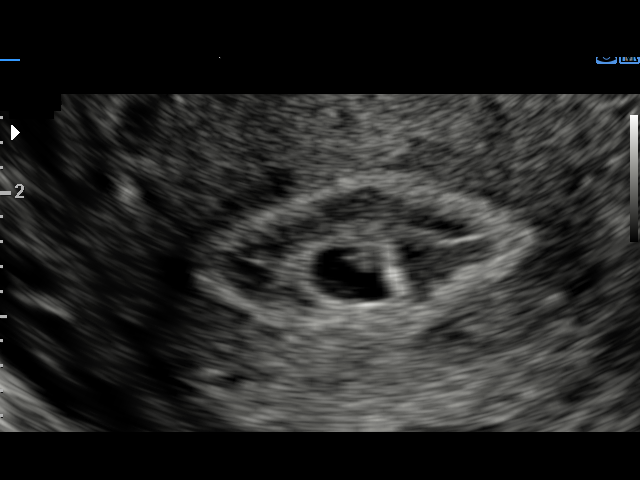

[15 of 28 positions shown; findings below may reference images not displayed]

FINDINGS: Intrauterine gestational sac: Single

Yolk sac:  Visualized.

Embryo:  Visualized.

Cardiac Activity: Visualized.

Heart Rate: 90 bpm

MSD:   mm    w     d

CRL:  1.6 mm

Subchorionic hemorrhage:  None visualized.

Maternal uterus/adnexae: Normal.
IMPRESSION: Single live IUP

## 2021-12-18 ENCOUNTER — Encounter (HOSPITAL_COMMUNITY): Payer: Self-pay | Admitting: *Deleted

## 2021-12-18 ENCOUNTER — Inpatient Hospital Stay (HOSPITAL_COMMUNITY)
Admission: AD | Admit: 2021-12-18 | Discharge: 2021-12-18 | Disposition: A | Discharge status: Home / Self Care | Payer: Medicaid Other | Attending: Obstetrics & Gynecology | Admitting: Obstetrics & Gynecology

## 2021-12-18 DIAGNOSIS — O209 Hemorrhage in early pregnancy, unspecified: Secondary | ICD-10-CM

## 2021-12-18 DIAGNOSIS — O26851 Spotting complicating pregnancy, first trimester: Secondary | ICD-10-CM | POA: Insufficient documentation

## 2021-12-18 DIAGNOSIS — O99891 Other specified diseases and conditions complicating pregnancy: Secondary | ICD-10-CM | POA: Diagnosis not present

## 2021-12-18 DIAGNOSIS — R000 Tachycardia, unspecified: Secondary | ICD-10-CM | POA: Insufficient documentation

## 2021-12-18 DIAGNOSIS — Z3A00 Weeks of gestation of pregnancy not specified: Secondary | ICD-10-CM | POA: Diagnosis not present

## 2021-12-18 DIAGNOSIS — Z3401 Encounter for supervision of normal first pregnancy, first trimester: Secondary | ICD-10-CM

## 2021-12-18 LAB — URINALYSIS, ROUTINE W REFLEX MICROSCOPIC
Bilirubin Urine: NEGATIVE
Glucose, UA: NEGATIVE mg/dL
Hgb urine dipstick: NEGATIVE
Ketones, ur: NEGATIVE mg/dL
Nitrite: NEGATIVE
Protein, ur: NEGATIVE mg/dL
Specific Gravity, Urine: 1.019 (ref 1.005–1.030)
pH: 6 (ref 5.0–8.0)

## 2021-12-18 LAB — WET PREP, GENITAL
Clue Cells Wet Prep HPF POC: NONE SEEN
Sperm: NONE SEEN
Trich, Wet Prep: NONE SEEN
WBC, Wet Prep HPF POC: <10 (ref <10)
Yeast Wet Prep HPF POC: NONE SEEN

## 2021-12-18 LAB — POCT PREGNANCY, URINE: Preg Test, Ur: POSITIVE — AB

## 2021-12-18 NOTE — MAU Provider Note (Signed)
?  S ?Ms. Lorraine Burton is a 26 y.o. [phone] patient who presents to MAU today with complaint of confirmation of pregnancy. She reports a positive home pregnancy test recently. She had some spotting and cramping last week, but none today. Patient reports no period since having her baby approximately 5 months ago. She is requesting STD screening as she had trichomonas with last pregnancy. She is planning to establish prenatal care at [facility] as that is where she went with her last pregnancy.   ? ?O ?BP 104/70   Pulse (!) 108   Temp 98.8 ?F (37.1 ?C)   Resp 18   LMP  (LMP Unknown) Comment: had baby 5 months ago has not had a period. +HPt 1 month ago  Breastfeeding No  ?Physical Exam ?Vitals and nursing note reviewed.  ?Constitutional:   ?   General: She is not in acute distress. ?Eyes:  ?   Extraocular Movements: Extraocular movements intact.  ?   Pupils: Pupils are equal, round, and reactive to light.  ?Cardiovascular:  ?   Rate and Rhythm: Tachycardia present.  ?Pulmonary:  ?   Effort: Pulmonary effort is normal.  ?Abdominal:  ?   Palpations: Abdomen is soft.  ?   Tenderness: There is no abdominal tenderness.  ?Genitourinary: ?   Comments: Patient self swabbed ?Musculoskeletal:     ?   General: Normal range of motion.  ?   Cervical back: Normal range of motion.  ?Skin: ?   General: Skin is warm and dry.  ?Neurological:  ?   General: No focal deficit present.  ?   Mental Status: She is alert and oriented to person, place, and time.  ?Psychiatric:     ?   Mood and Affect: Mood normal.     ?   Behavior: Behavior normal.     ?   Thought Content: Thought content normal.     ?   Judgment: Judgment normal.  ? ?FHR: 161 bpm ? ?A ?Medical screening exam complete ?Pregnancy test positive ?Vaginal spotting ? ?P ?Discharge from MAU in stable condition ?Will call patient regarding wet prep, GC/CT results ?Will schedule patient for outpatient dating ultrasound ?Warning signs for worsening condition that would warrant  emergency follow-up discussed. Patient may return to MAU as needed  ? ? ? ?Brand Males, CNM ?12/18/2021 1:27 PM  ? ?

## 2021-12-18 NOTE — MAU Note (Addendum)
.  Lorraine Burton is a 26 y.o. at Unknown here in MAU reporting: took HPT last month and it was positive. Had some light spotting last week none today. Has had some mild cramping on and off for several weeks ?LMP: pti s 5 months postpartum  No period since she has hd her baby. Not sure how far along she is.  ?Onset of complaint: last week ?Pain score: 0 ?Vitals:  ? 12/18/21 1303  ?BP: 104/70  ?Pulse: (!) 108  ?Resp: 18  ?Temp: 98.8 ?F (37.1 ?C)  ?   ? ?Lab orders placed from triage:   UPT ? ?Able to doppler FHR 161 ?

## 2021-12-18 NOTE — MAU Note (Signed)
Lorraine Burton talked with pt about results and f/u prenatal care. STD tested requested and pt self swabbed and cultures sent to  lab. Pt d/c'd home. ?

## 2021-12-19 LAB — GC/CHLAMYDIA PROBE AMP (~~LOC~~) NOT AT ARMC
Chlamydia: NEGATIVE
Comment: NEGATIVE
Comment: NORMAL
Neisseria Gonorrhea: NEGATIVE

## 2021-12-25 ENCOUNTER — Ambulatory Visit (INDEPENDENT_AMBULATORY_CARE_PROVIDER_SITE_OTHER): Payer: Medicaid Other

## 2021-12-25 ENCOUNTER — Ambulatory Visit: Payer: Medicaid Other

## 2021-12-25 VITALS — BP 110/71 | HR 110 | Ht 68.0 in | Wt 142.7 lb

## 2021-12-25 DIAGNOSIS — Z3491 Encounter for supervision of normal pregnancy, unspecified, first trimester: Secondary | ICD-10-CM | POA: Insufficient documentation

## 2021-12-25 DIAGNOSIS — O3680X0 Pregnancy with inconclusive fetal viability, not applicable or unspecified: Secondary | ICD-10-CM

## 2021-12-25 DIAGNOSIS — Z3481 Encounter for supervision of other normal pregnancy, first trimester: Secondary | ICD-10-CM

## 2021-12-25 DIAGNOSIS — Z3480 Encounter for supervision of other normal pregnancy, unspecified trimester: Secondary | ICD-10-CM | POA: Insufficient documentation

## 2021-12-25 MED ORDER — VITAFOL ULTRA 29-0.6-0.4-200 MG PO CAPS: 1.0000 | ORAL_CAPSULE | Freq: Every day | ORAL | 11 refills | Status: AC

## 2021-12-25 NOTE — Progress Notes (Signed)
Agree with nurses's documentation of this patient's clinic encounter.  Honorio Devol L, MD  

## 2021-12-25 NOTE — Progress Notes (Cosign Needed)
New OB Intake ? ?I connected with  [facility] on 12/25/21 at  1:10 PM EDT by in person Video and verified that I am speaking with the correct person using two identifiers. Nurse is located at [facility] and pt is located at [place]. ? ?I discussed the limitations, risks, security and privacy concerns of performing an evaluation and management service by telephone and the availability of in person appointments. I also discussed with the patient that there may be a patient responsible charge related to this service. The patient expressed understanding and agreed to proceed. ? ?I explained I am completing New OB Intake today. We discussed her EDD of 07/07/22 that is based on early u/s. Pt is G4/P2012. I reviewed her allergies, medications, Medical/Surgical/OB history, and appropriate screenings. I informed her of [facility] services. Based on history, this is a/an  pregnancy uncomplicated .  ? ?Patient Active Problem List  ? Diagnosis Date Noted  ? Gestational diabetes mellitus, class A1 07/21/2021  ? Postpartum care following vaginal delivery 07/21/2021  ? Gestational diabetes 05/02/2021  ? 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 Supervision of other normal pregnancy, antepartum 02/21/2021  ? Trichomonas infection 05/18/2020  ? ? ?Concerns addressed today ? ?Delivery Plans:  ?Plans to deliver at [facility].  ? ?MyChart/Babyscripts ?MyChart access verified. I explained pt will have some visits in office and some virtually. Babyscripts instructions given and order placed. Patient verifies receipt of registration text/e-mail. Account successfully created and app downloaded. ? ?Blood Pressure Cuff  ?Patient has cuff at home. Explained after first prenatal appt pt will check weekly and document in Babyscripts. ? ?Weight scale: Patient does have weight scale.  ? ?Anatomy US ?Explained first scheduled [place] will be around 19 weeks. Dating and viability scan performed today ? ?Labs ?Discussed Avelina Laine genetic screening with patient. Would  like both Panorama and Horizon drawn at new OB visit.Also if interested in genetic testing, tell patient she will need AFP 15-21 weeks to complete genetic testing .Routine prenatal labs needed. ? ?Covid Vaccine ?Patient has not covid vaccine.  ? ?Informed patient of [facility] website  and placed link in her AVS.  ? ?Social Determinants of Health ?Food Insecurity: Patient denies food insecurity. ?WIC Referral: Patient is interested in referral to [facility].  ?Transportation: Patient denies transportation needs. ?Childcare: Discussed no children allowed at ultrasound appointments. Offered childcare services; patient declines childcare services at this time. ? ?Send link to Pregnancy Navigators ? ? ?Placed OB Box on problem list and updated ? ?First visit review ?I reviewed new OB appt with pt. I explained she will have a pelvic exam, ob bloodwork with genetic screening, and PAP smear. Explained pt will be seen by Nettie Elm at first visit; encounter routed to appropriate provider. Explained that patient will be seen by pregnancy navigator following visit with provider. [facility] information placed in AVS.  ? ?Hamilton Capri, RN ?12/25/2021  1:17 PM  ?

## 2021-12-26 IMAGING — US US OB TRANSVAGINAL
1 series · 15 of 28 positions shown · non-contrast
Comparison: 05/13/2020

CLINICAL DATA: Confirm viability

EXAM:
TRANSVAGINAL OB ULTRASOUND
TECHNIQUE: Transvaginal ultrasound was performed for complete evaluation of the
gestation as well as the maternal uterus, adnexal regions, and
pelvic cul-de-sac.

[Series 1: us ob transvaginal · 15 of 41 slices shown]
[im 1/41]
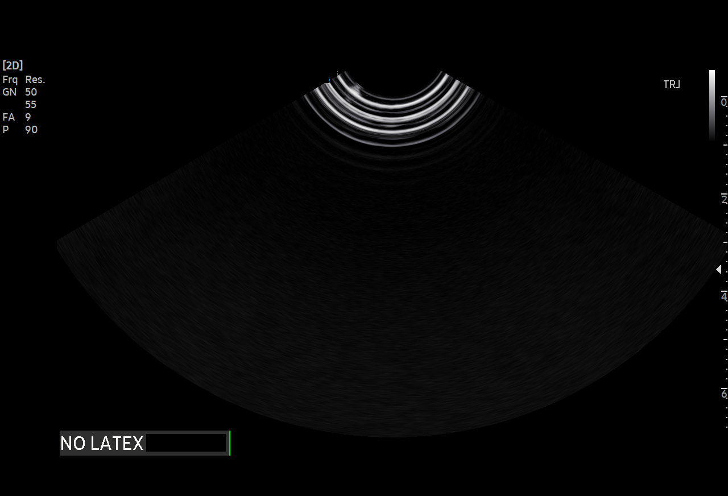
[im 3/41]
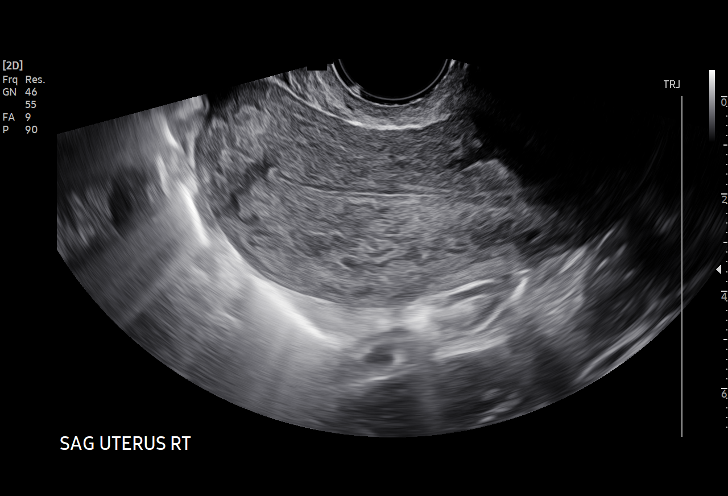
[im 6/41]
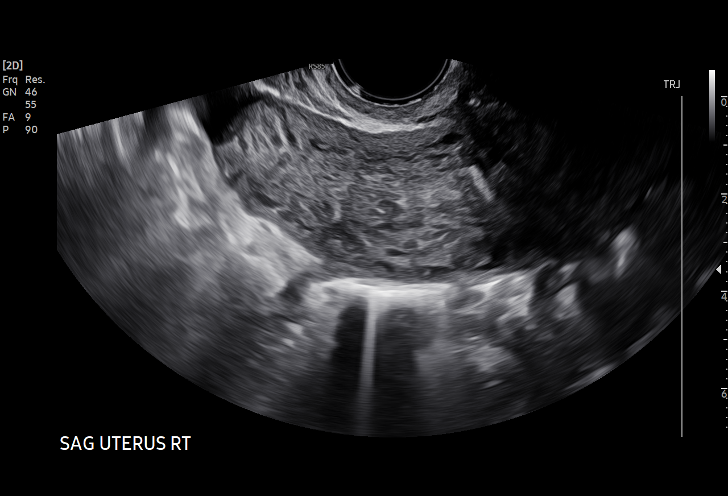
[im 9/41]
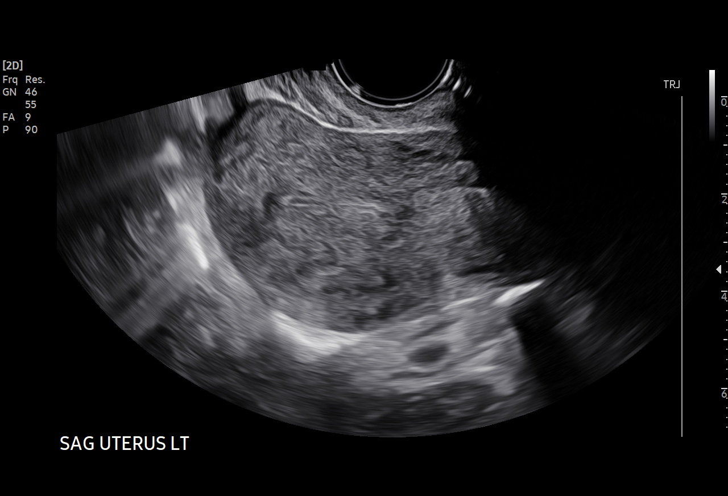
[im 12/41]
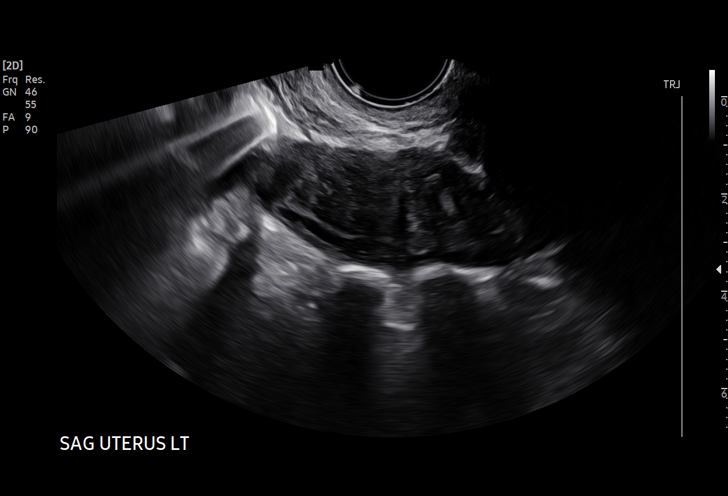
[im 15/41]
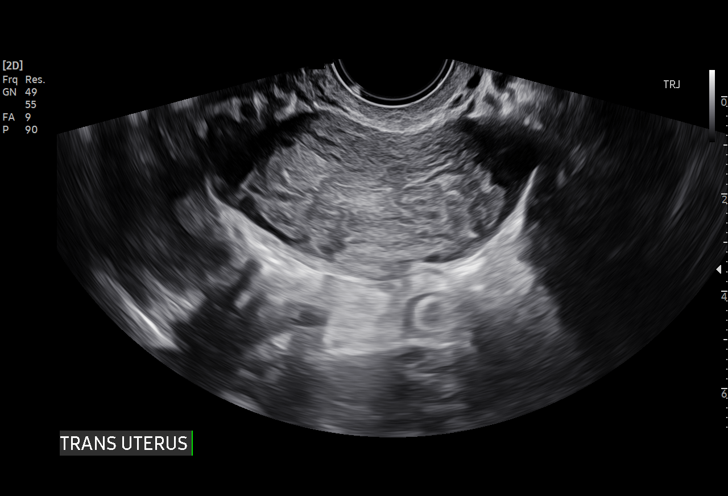
[im 18/41]
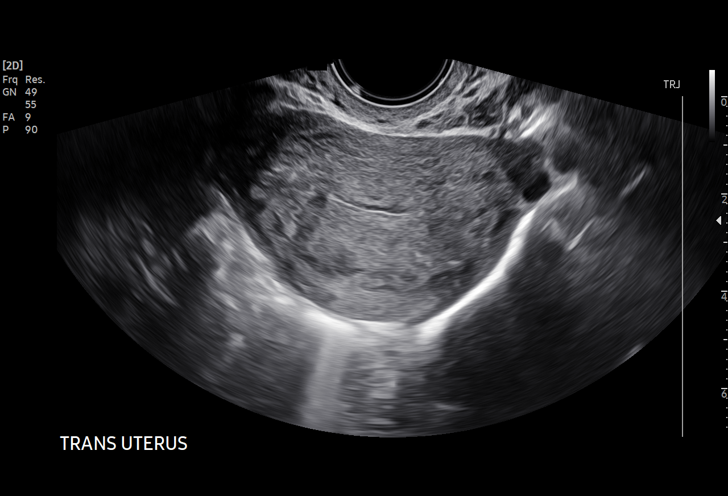
[im 21/41]
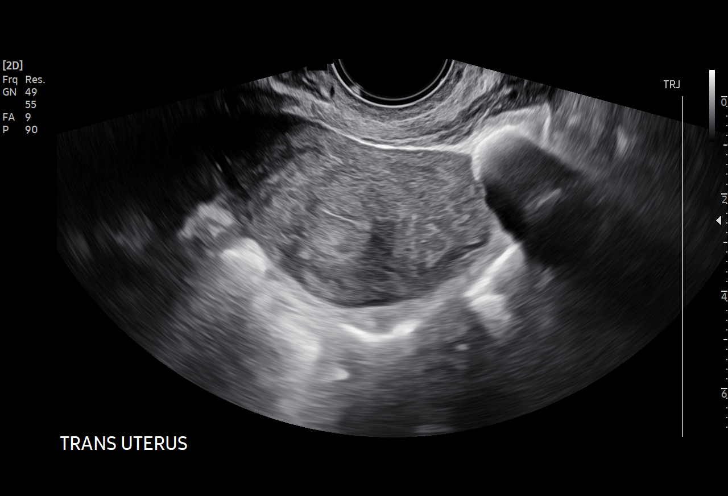
[im 23/41]
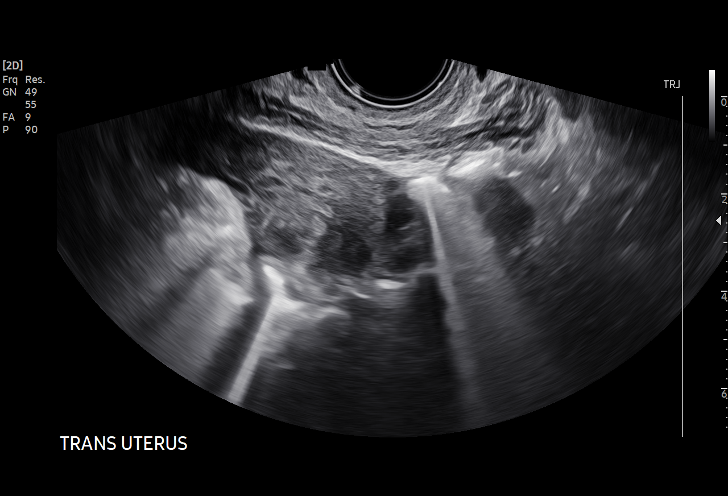
[im 26/41]
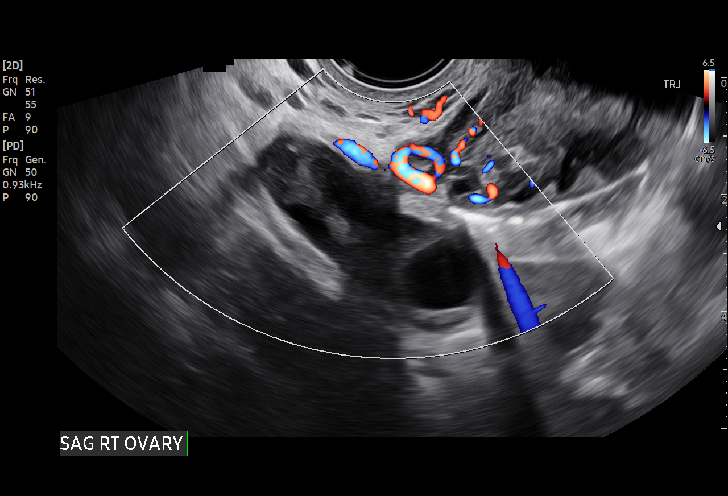
[im 29/41]
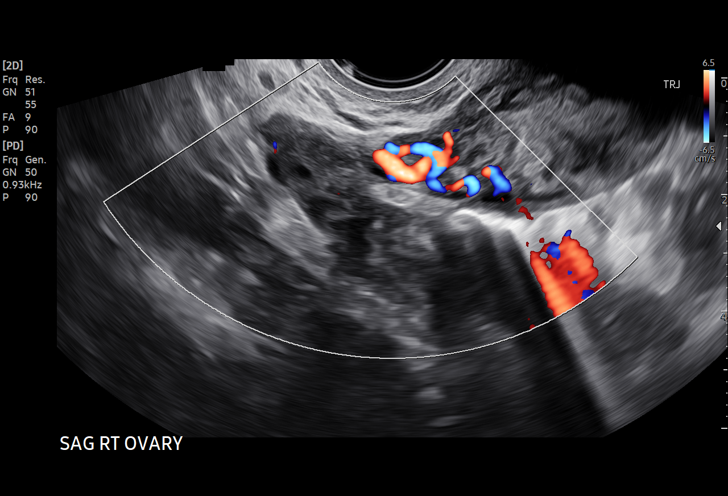
[im 32/41]
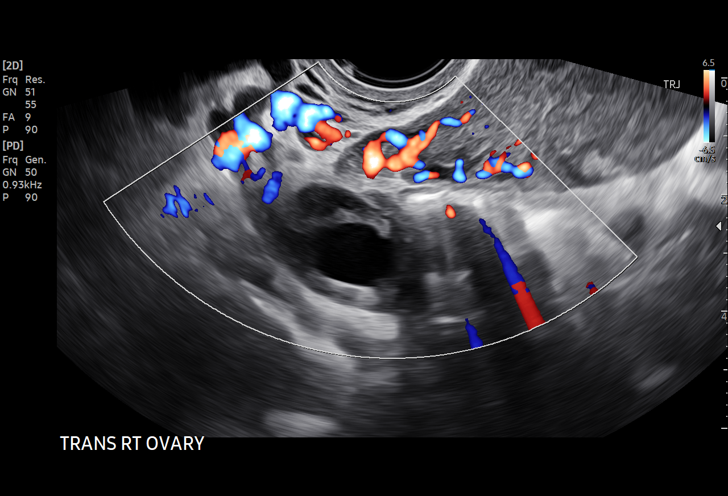
[im 35/41]
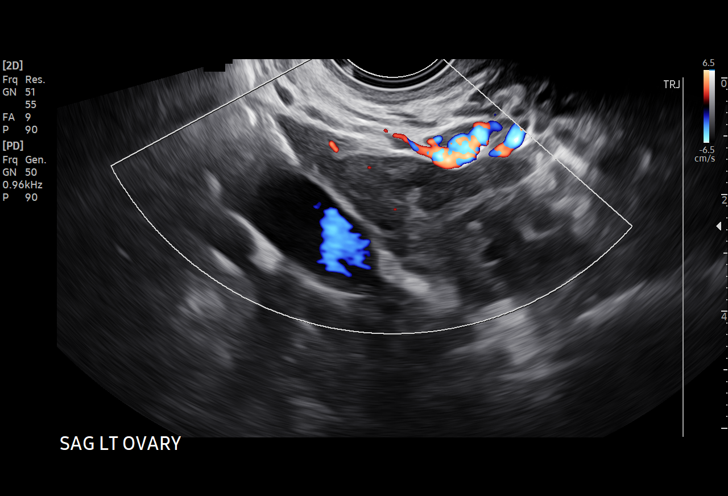
[im 38/41]
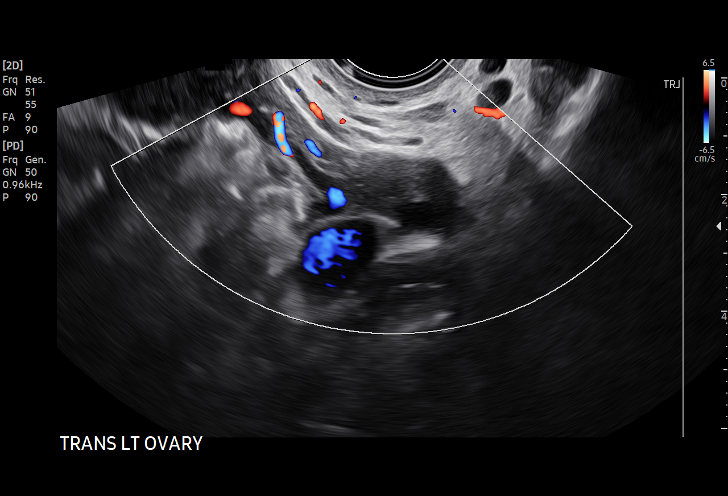
[im 41/41]
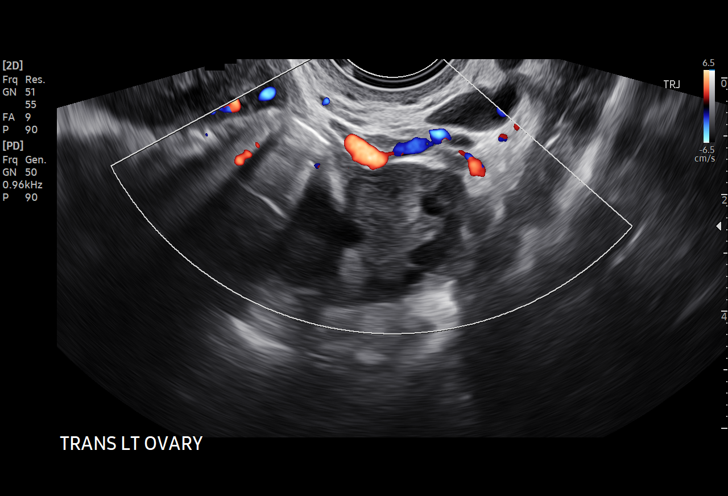

[15 of 28 positions shown; findings below may reference images not displayed]

FINDINGS: Intrauterine gestational sac is no longer seen. The endometrial
thickness is 4 mm and smooth. No adnexal mass. No pelvic ascites.
IMPRESSION: The intrauterine pregnancy is no longer seen.

Thin endometrium (4 mm).

## 2022-01-01 ENCOUNTER — Encounter: Payer: Medicaid Other | Admitting: Obstetrics

## 2022-01-16 ENCOUNTER — Ambulatory Visit (INDEPENDENT_AMBULATORY_CARE_PROVIDER_SITE_OTHER): Payer: Medicaid Other | Admitting: Obstetrics and Gynecology

## 2022-01-16 ENCOUNTER — Encounter: Payer: Self-pay | Admitting: Obstetrics and Gynecology

## 2022-01-16 ENCOUNTER — Other Ambulatory Visit: Payer: Self-pay | Admitting: *Deleted

## 2022-01-16 ENCOUNTER — Other Ambulatory Visit (HOSPITAL_COMMUNITY)
Admission: RE | Admit: 2022-01-16 | Discharge: 2022-01-16 | Disposition: A | Discharge status: Home / Self Care | Payer: Medicaid Other | Source: Ambulatory Visit | Attending: Obstetrics | Admitting: Obstetrics

## 2022-01-16 VITALS — BP 101/68 | HR 93 | Wt 148.1 lb

## 2022-01-16 DIAGNOSIS — Z3A15 15 weeks gestation of pregnancy: Secondary | ICD-10-CM

## 2022-01-16 DIAGNOSIS — Z8632 Personal history of gestational diabetes: Secondary | ICD-10-CM | POA: Diagnosis not present

## 2022-01-16 DIAGNOSIS — Z3481 Encounter for supervision of other normal pregnancy, first trimester: Secondary | ICD-10-CM

## 2022-01-16 NOTE — Addendum Note (Signed)
Addended by: Marya Landry D on: 01/16/2022 03:42 PM ? ? Modules accepted: Orders ? ?

## 2022-01-16 NOTE — Progress Notes (Signed)
Subjective:  ?Lorraine Burton is a 26 y.o. [id number] at [redacted]w[redacted]d being seen today for her first OB visit. EDD by first trimester U/S. H/O TSVD x 2 without problems. H/O GDM A1 with last pregnancy. Denies any chronic medications or medical problems.  ongoing prenatal care.  She is currently monitored for the following issues for this low-risk pregnancy and has Supervision of normal pregnancy in first trimester and History of gestational diabetes on their problem list. ? ?Patient reports no complaints.  Contractions: Not present. Vag. Bleeding: None.   . Denies leaking of fluid.  ? ?The following portions of the patient's history were reviewed and updated as appropriate: allergies, current medications, past family history, past medical history, past social history, past surgical history and problem list. Problem list updated. ? ?Objective:  ? ?Vitals:  ? 01/16/22 1455  ?BP: 101/68  ?Pulse: 93  ?Weight: 148 lb 1.6 oz (67.2 kg)  ? ? ?Fetal Status: Fetal Heart Rate (bpm): 152        ? ?General:  Alert, oriented and cooperative. Patient is in no acute distress.  ?Skin: Skin is warm and dry. No rash noted.   ?Cardiovascular: Normal heart rate noted  ?Respiratory: Normal respiratory effort, no problems with respiration noted  ?Abdomen: Soft, gravid, appropriate for gestational age. Pain/Pressure: Absent     ?Pelvic:  Cervical exam deferred        ?Extremities: Normal range of motion.  Edema: None  ?Mental Status: Normal mood and affect. Normal behavior. Normal judgment and thought content.  ? ?Urinalysis:     ? ?Assessment and Plan:  ?Pregnancy: [id number] at [redacted]w[redacted]d ? ?1. History of gestational diabetes ?A1C today ? ?2. Encounter for supervision of other normal pregnancy in first trimester ?Prenatal care and labs reviewed with pt ?Genetic testing discussed ?Anatomy scan ordered ? ? ?Preterm labor symptoms and general obstetric precautions including but not limited to vaginal bleeding, contractions, leaking of fluid and fetal  movement were reviewed in detail with the patient. ?Please refer to After Visit Summary for other counseling recommendations.  ?Return in about 4 weeks (around 02/13/2022) for OB visit, face to face, any provider. ? ? ?Hermina Staggers, MD ?

## 2022-01-16 NOTE — Patient Instructions (Signed)

## 2022-01-16 NOTE — Progress Notes (Signed)
New OB ?OB Panel, OB urine, GC/CC off urine ?Pap UTD ?Genetic screening offered and accepted ?Depression and anxiety screen negative at intake ?

## 2022-01-17 LAB — CBC/D/PLT+RPR+RH+ABO+RUBIGG...
Antibody Screen: NEGATIVE
Basophils Absolute: 0 10*3/uL (ref 0.0–0.2)
Basos: 0 %
EOS (ABSOLUTE): 0 10*3/uL (ref 0.0–0.4)
Eos: 0 %
HCV Ab: NONREACTIVE
HIV Screen 4th Generation wRfx: NONREACTIVE
Hematocrit: 32 % — ABNORMAL LOW (ref 34.0–46.6)
Hemoglobin: 10.7 g/dL — ABNORMAL LOW (ref 11.1–15.9)
Hepatitis B Surface Ag: NEGATIVE
Immature Grans (Abs): 0 10*3/uL (ref 0.0–0.1)
Immature Granulocytes: 0 %
Lymphocytes Absolute: 1.3 10*3/uL (ref 0.7–3.1)
Lymphs: 18 %
MCH: 32.9 pg (ref 26.6–33.0)
MCHC: 33.4 g/dL (ref 31.5–35.7)
MCV: 99 fL — ABNORMAL HIGH (ref 79–97)
Monocytes Absolute: 0.5 10*3/uL (ref 0.1–0.9)
Monocytes: 7 %
Neutrophils Absolute: 5.4 10*3/uL (ref 1.4–7.0)
Neutrophils: 75 %
Platelets: 220 10*3/uL (ref 150–450)
RBC: 3.25 x10E6/uL — ABNORMAL LOW (ref 3.77–5.28)
RDW: 11.3 % — ABNORMAL LOW (ref 11.7–15.4)
RPR Ser Ql: NONREACTIVE
Rh Factor: POSITIVE
Rubella Antibodies, IGG: 8.63 index (ref >0.99)
WBC: 7.3 10*3/uL (ref 3.4–10.8)

## 2022-01-17 LAB — URINE CYTOLOGY ANCILLARY ONLY
Chlamydia: NEGATIVE
Comment: NEGATIVE
Comment: NORMAL
Neisseria Gonorrhea: NEGATIVE

## 2022-01-17 LAB — HEMOGLOBIN A1C
Est. average glucose Bld gHb Est-mCnc: <74 mg/dL
Hgb A1c MFr Bld: <4.2 % — ABNORMAL LOW (ref 4.8–5.6)

## 2022-01-17 LAB — HCV INTERPRETATION

## 2022-01-18 ENCOUNTER — Other Ambulatory Visit: Payer: Self-pay

## 2022-01-18 LAB — AFP, SERUM, OPEN SPINA BIFIDA
AFP MoM: 0.87
AFP Value: 30.2 ng/mL
Gest. Age on Collection Date: 15.3 weeks
Maternal Age At EDD: 27.9 yr
OSBR Risk 1 IN: 10000
Test Results:: NEGATIVE
Weight: 148 [lb_av]

## 2022-01-18 MED ORDER — FERROUS SULFATE 325 (65 FE) MG PO TABS: 325.0000 mg | ORAL_TABLET | Freq: Every day | ORAL | 0 refills | Status: AC

## 2022-01-19 LAB — URINE CULTURE, OB REFLEX

## 2022-01-19 LAB — CULTURE, OB URINE

## 2022-01-23 ENCOUNTER — Encounter: Payer: Self-pay | Admitting: Obstetrics and Gynecology

## 2022-02-12 ENCOUNTER — Ambulatory Visit: Payer: Medicaid Other | Attending: Obstetrics and Gynecology

## 2022-02-12 ENCOUNTER — Ambulatory Visit (INDEPENDENT_AMBULATORY_CARE_PROVIDER_SITE_OTHER): Payer: Medicaid Other | Admitting: Advanced Practice Midwife

## 2022-02-12 VITALS — BP 110/66 | HR 121 | Wt 157.2 lb

## 2022-02-12 DIAGNOSIS — Z3480 Encounter for supervision of other normal pregnancy, unspecified trimester: Secondary | ICD-10-CM

## 2022-02-12 DIAGNOSIS — Z3A19 19 weeks gestation of pregnancy: Secondary | ICD-10-CM

## 2022-02-12 NOTE — Progress Notes (Signed)
Patient presents for ROB. Patient has no concerns today. 

## 2022-02-12 NOTE — Progress Notes (Signed)
   PRENATAL VISIT NOTE  Subjective:  Lorraine Burton is a 26 y.o. [id number] at [redacted]w[redacted]d being seen today for ongoing prenatal care.  She is currently monitored for the following issues for this low-risk pregnancy and has Supervision of normal pregnancy in first trimester and History of gestational diabetes on their problem list.  Patient reports no complaints.  Contractions: Not present. Vag. Bleeding: None.  Movement: Present. Denies leaking of fluid.   The following portions of the patient's history were reviewed and updated as appropriate: allergies, current medications, past family history, past medical history, past social history, past surgical history and problem list.   Objective:   Vitals:   02/12/22 1636  BP: 110/66  Pulse: (!) 121  Weight: 157 lb 3.2 oz (71.3 kg)    Fetal Status: Fetal Heart Rate (bpm): 154   Movement: Present     General:  Alert, oriented and cooperative. Patient is in no acute distress.  Skin: Skin is warm and dry. No rash noted.   Cardiovascular: Normal heart rate noted  Respiratory: Normal respiratory effort, no problems with respiration noted  Abdomen: Soft, gravid, appropriate for gestational age.  Pain/Pressure: Absent     Pelvic: Cervical exam deferred        Extremities: Normal range of motion.  Edema: None  Mental Status: Normal mood and affect. Normal behavior. Normal judgment and thought content.   Assessment and Plan:  Pregnancy: [id number] at [redacted]w[redacted]d 1. [redacted] weeks gestation of pregnancy   2. Supervision of other normal pregnancy, antepartum --Anticipatory guidance about next visits/weeks of pregnancy given.   --Pt missed MFM appt today, reschedule anatomy US  Preterm labor symptoms and general obstetric precautions including but not limited to vaginal bleeding, contractions, leaking of fluid and fetal movement were reviewed in detail with the patient. Please refer to After Visit Summary for other counseling recommendations.   Return for LOB,  Any provider. Reschedule MFM ultrasound. Pt missed appt today..  No future appointments.  Fatima Blank, CNM

## 2022-02-13 ENCOUNTER — Encounter: Payer: Medicaid Other | Admitting: Student

## 2022-03-02 ENCOUNTER — Inpatient Hospital Stay (HOSPITAL_COMMUNITY)
Admission: AD | Admit: 2022-03-02 | Discharge: 2022-03-02 | Disposition: A | Discharge status: Home / Self Care | Payer: Medicaid Other | Attending: Obstetrics and Gynecology | Admitting: Obstetrics and Gynecology

## 2022-03-02 DIAGNOSIS — Z3492 Encounter for supervision of normal pregnancy, unspecified, second trimester: Secondary | ICD-10-CM

## 2022-03-02 DIAGNOSIS — Z3A21 21 weeks gestation of pregnancy: Secondary | ICD-10-CM | POA: Insufficient documentation

## 2022-03-02 DIAGNOSIS — O26892 Other specified pregnancy related conditions, second trimester: Secondary | ICD-10-CM | POA: Insufficient documentation

## 2022-03-02 NOTE — MAU Note (Signed)
.  Lorraine Burton is a 26 y.o. at [redacted]w[redacted]d here in MAU reporting: has had DFM for the last two days. Did feel flutters yesterday. Attempted to call the office yesterday to make an appointment, but they had already closed. Denies VB, LOF, or pain.   Pain score: 0 Vitals:   03/02/22 0926  BP: 116/61  Pulse: 80  Resp: 15  Temp: 98.9 F (37.2 C)  SpO2: 100%     FHT:145

## 2022-03-02 NOTE — MAU Provider Note (Signed)
Event Date/Time   First Provider Initiated Contact with Patient 03/02/22 0930      S Ms. Lorraine Burton is a 26 y.o. [phone] patient who presents to MAU today with complaint of decreased fetal movement. She reports she has felt flutters but not normal movement for 2 days. She denies any bleeding, leaking or abdominal pain.   O BP 116/61 (BP Location: Right Arm)   Pulse 80   Temp 98.9 F (37.2 C) (Oral)   Resp 15   Ht 5\' 8"  (1.727 m)   Wt 75.2 kg   LMP  (LMP Unknown) Comment: had baby 5 months ago has not had a period. +HPt 1 month ago  SpO2 100%   BMI 25.19 kg/m  Physical Exam Vitals and nursing note reviewed.  Constitutional:      General: She is not in acute distress.    Appearance: She is well-developed.  HENT:     Head: Normocephalic.  Eyes:     Pupils: Pupils are equal, round, and reactive to light.  Cardiovascular:     Rate and Rhythm: Normal rate and regular rhythm.     Heart sounds: Normal heart sounds.  Pulmonary:     Effort: Pulmonary effort is normal. No respiratory distress.     Breath sounds: Normal breath sounds.  Abdominal:     General: Bowel sounds are normal. There is no distension.     Palpations: Abdomen is soft.     Tenderness: There is no abdominal tenderness.  Skin:    General: Skin is warm and dry.  Neurological:     Mental Status: She is alert and oriented to person, place, and time.  Psychiatric:        Mood and Affect: Mood normal.        Behavior: Behavior normal.        Thought Content: Thought content normal.        Judgment: Judgment normal.    FHT: 145 bpm  A Medical screening exam complete 1. Presence of fetal heart sounds in second trimester   2. [redacted] weeks gestation of pregnancy    Patient feels reassured after hearing heart tones. Expectations for movement at this gestation reviewed at length  P -Discharge home in stable condition -Second trimester precautions discussed -Patient advised to follow-up with OB as scheduled  for prenatal care -Patient may return to MAU as needed or if her condition were to change or worsen   , Rolm Bookbinder 03/02/2022 9:30 AM

## 2022-03-02 NOTE — Discharge Instructions (Signed)

## 2022-03-12 ENCOUNTER — Encounter: Payer: Medicaid Other | Admitting: Advanced Practice Midwife

## 2022-03-12 NOTE — Progress Notes (Deleted)
   PRENATAL VISIT NOTE  Subjective:  Lorraine Burton is a 26 y.o. [id number] at [redacted]w[redacted]d being seen today for ongoing prenatal care.  She is currently monitored for the following issues for this {Blank single:19197::"high-risk","low-risk"} pregnancy and has Supervision of normal pregnancy in first trimester and History of gestational diabetes on their problem list.  Patient reports {sx:[id number]}.   .  .   . Denies leaking of fluid.   The following portions of the patient's history were reviewed and updated as appropriate: allergies, current medications, past family history, past medical history, past social history, past surgical history and problem list.   Objective:  There were no vitals filed for this visit.  Fetal Status:           General:  Alert, oriented and cooperative. Patient is in no acute distress.  Skin: Skin is warm and dry. No rash noted.   Cardiovascular: Normal heart rate noted  Respiratory: Normal respiratory effort, no problems with respiration noted  Abdomen: Soft, gravid, appropriate for gestational age.        Pelvic: {Blank single:19197::"Cervical exam performed in the presence of a chaperone","Cervical exam deferred"}        Extremities: Normal range of motion.     Mental Status: Normal mood and affect. Normal behavior. Normal judgment and thought content.   Assessment and Plan:  Pregnancy: [id number] at [redacted]w[redacted]d 1. Supervision of other normal pregnancy, antepartum *** --Need to reschedule MFM US--pt missed appt  2. History of gestational diabetes ***  3. [redacted] weeks gestation of pregnancy ***  {Blank single:19197::"Term","Preterm"} labor symptoms and general obstetric precautions including but not limited to vaginal bleeding, contractions, leaking of fluid and fetal movement were reviewed in detail with the patient. Please refer to After Visit Summary for other counseling recommendations.   No follow-ups on file.  Future Appointments  Date Time Provider Department  Center  03/12/2022  4:10 PM Leftwich-Kirby, Wilmer Floor, CNM [facility] None    Sharen Counter, CNM

## 2022-03-13 ENCOUNTER — Telehealth: Payer: Self-pay

## 2022-03-27 ENCOUNTER — Encounter: Payer: Medicaid Other | Admitting: Advanced Practice Midwife

## 2022-03-27 ENCOUNTER — Encounter: Payer: Self-pay | Admitting: Obstetrics and Gynecology

## 2022-03-27 ENCOUNTER — Ambulatory Visit (INDEPENDENT_AMBULATORY_CARE_PROVIDER_SITE_OTHER): Payer: Medicaid Other | Admitting: Obstetrics and Gynecology

## 2022-03-27 VITALS — BP 117/70 | HR 105 | Wt 171.6 lb

## 2022-03-27 DIAGNOSIS — Z3481 Encounter for supervision of other normal pregnancy, first trimester: Secondary | ICD-10-CM

## 2022-03-27 DIAGNOSIS — Z8632 Personal history of gestational diabetes: Secondary | ICD-10-CM

## 2022-03-27 DIAGNOSIS — Z3A25 25 weeks gestation of pregnancy: Secondary | ICD-10-CM

## 2022-03-27 NOTE — Progress Notes (Signed)
   PRENATAL VISIT NOTE  Subjective:  Lorraine Burton is a 26 y.o. [id number] at [redacted]w[redacted]d being seen today for ongoing prenatal care.  She is currently monitored for the following issues for this low-risk pregnancy and has Supervision of normal pregnancy in first trimester and History of gestational diabetes on their problem list.  Patient doing well with no acute concerns today. She reports no complaints.  Contractions: Not present. Vag. Bleeding: None.  Movement: Present. Denies leaking of fluid.   The following portions of the patient's history were reviewed and updated as appropriate: allergies, current medications, past family history, past medical history, past social history, past surgical history and problem list. Problem list updated.  Objective:   Vitals:   03/27/22 1445  BP: 117/70  Pulse: (!) 105  Weight: 171 lb 9.6 oz (77.8 kg)    Fetal Status: Fetal Heart Rate (bpm): 152 Fundal Height: 25 cm Movement: Present     General:  Alert, oriented and cooperative. Patient is in no acute distress.  Skin: Skin is warm and dry. No rash noted.   Cardiovascular: Normal heart rate noted  Respiratory: Normal respiratory effort, no problems with respiration noted  Abdomen: Soft, gravid, appropriate for gestational age.  Pain/Pressure: Absent     Pelvic: Cervical exam deferred        Extremities: Normal range of motion.  Edema: None  Mental Status:  Normal mood and affect. Normal behavior. Normal judgment and thought content.   Assessment and Plan:  Pregnancy: [id number] at [redacted]w[redacted]d  1. Encounter for supervision of other normal pregnancy in first trimester Continue routine prenatal care, pt missed appt for anatomy scan, will try to reschedule  - [place] MFM OB COMP + 14 WK; Future  2. [redacted] weeks gestation of pregnancy   3. History of gestational diabetes 2 hour GTT next visit  Preterm labor symptoms and general obstetric precautions including but not limited to vaginal bleeding, contractions,  leaking of fluid and fetal movement were reviewed in detail with the patient.  Please refer to After Visit Summary for other counseling recommendations.   Return in about 2 weeks (around 04/10/2022) for ROB, in person, 2 hr GTT, 3rd trim labs.   Mariel Aloe, MD Faculty Attending Center for [facility]

## 2022-03-27 NOTE — Progress Notes (Signed)
Pt present for ROB visit with no concerns at this time.

## 2022-03-27 NOTE — Progress Notes (Deleted)
   PRENATAL VISIT NOTE  Subjective:  Lorraine Burton is a 26 y.o. [id number] at [redacted]w[redacted]d being seen today for ongoing prenatal care.  She is currently monitored for the following issues for this {Blank single:19197::"high-risk","low-risk"} pregnancy and has Supervision of normal pregnancy in first trimester and History of gestational diabetes on their problem list.  Patient reports {sx:14538}.   .  .   . Denies leaking of fluid.   The following portions of the patient's history were reviewed and updated as appropriate: allergies, current medications, past family history, past medical history, past social history, past surgical history and problem list.   Objective:  There were no vitals filed for this visit.  Fetal Status:           General:  Alert, oriented and cooperative. Patient is in no acute distress.  Skin: Skin is warm and dry. No rash noted.   Cardiovascular: Normal heart rate noted  Respiratory: Normal respiratory effort, no problems with respiration noted  Abdomen: Soft, gravid, appropriate for gestational age.        Pelvic: {Blank single:19197::"Cervical exam performed in the presence of a chaperone","Cervical exam deferred"}        Extremities: Normal range of motion.     Mental Status: Normal mood and affect. Normal behavior. Normal judgment and thought content.   Assessment and Plan:  Pregnancy: L2G4010 at [redacted]w[redacted]d 1. Supervision of other normal pregnancy, antepartum *** --Pt missed MFM [place] appt today, has not had anatomy US.  Please reschedule  2. [redacted] weeks gestation of pregnancy ***  {Blank single:19197::"Term","Preterm"} labor symptoms and general obstetric precautions including but not limited to vaginal bleeding, contractions, leaking of fluid and fetal movement were reviewed in detail with the patient. Please refer to After Visit Summary for other counseling recommendations.   No follow-ups on file.  Future Appointments  Date Time Provider Department Center   03/27/2022 10:35 AM Leftwich-Kirby, Wilmer Floor, CNM [facility] None    Sharen Counter, CNM

## 2022-04-12 ENCOUNTER — Encounter (HOSPITAL_COMMUNITY): Payer: Self-pay | Admitting: Obstetrics and Gynecology

## 2022-04-12 ENCOUNTER — Inpatient Hospital Stay (HOSPITAL_COMMUNITY)
Admission: AD | Admit: 2022-04-12 | Discharge: 2022-04-12 | Disposition: A | Discharge status: Home / Self Care | Payer: Medicaid Other | Attending: Obstetrics and Gynecology | Admitting: Obstetrics and Gynecology

## 2022-04-12 DIAGNOSIS — M25551 Pain in right hip: Secondary | ICD-10-CM | POA: Diagnosis not present

## 2022-04-12 DIAGNOSIS — M7918 Myalgia, other site: Secondary | ICD-10-CM | POA: Insufficient documentation

## 2022-04-12 DIAGNOSIS — O26892 Other specified pregnancy related conditions, second trimester: Secondary | ICD-10-CM | POA: Diagnosis not present

## 2022-04-12 DIAGNOSIS — Z3A27 27 weeks gestation of pregnancy: Secondary | ICD-10-CM

## 2022-04-12 DIAGNOSIS — W19XXXA Unspecified fall, initial encounter: Secondary | ICD-10-CM | POA: Diagnosis not present

## 2022-04-12 DIAGNOSIS — R1030 Lower abdominal pain, unspecified: Secondary | ICD-10-CM | POA: Diagnosis not present

## 2022-04-12 DIAGNOSIS — Z3481 Encounter for supervision of other normal pregnancy, first trimester: Secondary | ICD-10-CM

## 2022-04-12 DIAGNOSIS — W1830XA Fall on same level, unspecified, initial encounter: Secondary | ICD-10-CM | POA: Insufficient documentation

## 2022-04-12 DIAGNOSIS — Z79899 Other long term (current) drug therapy: Secondary | ICD-10-CM | POA: Insufficient documentation

## 2022-04-12 LAB — URINALYSIS, ROUTINE W REFLEX MICROSCOPIC
Bilirubin Urine: NEGATIVE
Glucose, UA: NEGATIVE mg/dL
Hgb urine dipstick: NEGATIVE
Ketones, ur: 20 mg/dL — AB
Leukocytes,Ua: NEGATIVE
Nitrite: NEGATIVE
Protein, ur: NEGATIVE mg/dL
Specific Gravity, Urine: 1.018 (ref 1.005–1.030)
pH: 7 (ref 5.0–8.0)

## 2022-04-12 MED ORDER — CYCLOBENZAPRINE HCL 5 MG PO TABS
5.0000 mg | ORAL_TABLET | Freq: Three times a day (TID) | ORAL | 0 refills | Status: DC | PRN
Start: 2022-04-12 — End: 2022-05-01

## 2022-04-12 MED ORDER — ACETAMINOPHEN 500 MG PO TABS
1000.0000 mg | ORAL_TABLET | Freq: Once | ORAL | Status: AC
  Administered 2022-04-12: 1000 mg via ORAL
  Filled 2022-04-12: qty 2

## 2022-04-12 MED ORDER — CYCLOBENZAPRINE HCL 5 MG PO TABS
10.0000 mg | ORAL_TABLET | Freq: Once | ORAL | Status: AC
  Administered 2022-04-12: 10 mg via ORAL
  Filled 2022-04-12: qty 2
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
## 2022-04-16 ENCOUNTER — Ambulatory Visit (INDEPENDENT_AMBULATORY_CARE_PROVIDER_SITE_OTHER): Payer: Medicaid Other | Admitting: Family Medicine

## 2022-04-16 VITALS — BP 99/64 | HR 88 | Wt 171.0 lb

## 2022-04-16 DIAGNOSIS — O99013 Anemia complicating pregnancy, third trimester: Secondary | ICD-10-CM

## 2022-04-16 DIAGNOSIS — Z3A28 28 weeks gestation of pregnancy: Secondary | ICD-10-CM

## 2022-04-16 DIAGNOSIS — Z8632 Personal history of gestational diabetes: Secondary | ICD-10-CM

## 2022-04-16 DIAGNOSIS — Z3480 Encounter for supervision of other normal pregnancy, unspecified trimester: Secondary | ICD-10-CM

## 2022-04-16 DIAGNOSIS — D508 Other iron deficiency anemias: Secondary | ICD-10-CM

## 2022-04-16 NOTE — Progress Notes (Signed)
   PRENATAL VISIT NOTE  Subjective:  Lorraine Burton is a 26 y.o. [id number] at [redacted]w[redacted]d being seen today for ongoing prenatal care.  She is currently monitored for the following issues for this low-risk pregnancy and has Supervision of normal pregnancy in first trimester and History of gestational diabetes on their problem list.  Patient reports no complaints.  Contractions: Not present. Vag. Bleeding: None.  Movement: Present. Denies leaking of fluid.   The following portions of the patient's history were reviewed and updated as appropriate: allergies, current medications, past family history, past medical history, past social history, past surgical history and problem list.   Objective:   Vitals:   04/16/22 0836  BP: 99/64  Pulse: 88  Weight: 171 lb (77.6 kg)    Fetal Status: Fetal Heart Rate (bpm): 161 Fundal Height: 28 cm Movement: Present     General:  Alert, oriented and cooperative. Patient is in no acute distress.  Skin: Skin is warm and dry. No rash noted.   Cardiovascular: Normal heart rate noted  Respiratory: Normal respiratory effort, no problems with respiration noted  Abdomen: Soft, gravid, appropriate for gestational age.  Pain/Pressure: Absent     Pelvic: Cervical exam deferred        Extremities: Normal range of motion.  Edema: None  Mental Status: Normal mood and affect. Normal behavior. Normal judgment and thought content.   Assessment and Plan:  Pregnancy: [id number] at [redacted]w[redacted]d 1. Supervision of other normal pregnancy, antepartum 28 wk labs today. FH and FH normal Still considering BTL - will sign papers today. Thinking about waterbirth. Gave information about signing up for classes.  Handout given. - Glucose Tolerance, 2 Hours w/1 Hour - CBC - RPR - HIV Antibody (routine testing w rflx)  2. History of gestational diabetes   Preterm labor symptoms and general obstetric precautions including but not limited to vaginal bleeding, contractions, leaking of fluid  and fetal movement were reviewed in detail with the patient. Please refer to After Visit Summary for other counseling recommendations.   Return in about 2 weeks (around 04/30/2022) for OB f/u.  Future Appointments  Date Time Provider Department Center  04/18/2022  2:45 PM [facility]    Levie Heritage, DO

## 2022-04-16 NOTE — Patient Instructions (Signed)

## 2022-04-16 NOTE — Progress Notes (Signed)
ROB 28.[redacted] wks GA GTT, CBC, HIV, RPR today TDAP offered and deferred by pt Depression and anxiety screen negative  Anatomy scan Thursday 7/27

## 2022-04-17 LAB — CBC
Hematocrit: 29.7 % — ABNORMAL LOW (ref 34.0–46.6)
Hemoglobin: 9.3 g/dL — ABNORMAL LOW (ref 11.1–15.9)
MCH: 28.4 pg (ref 26.6–33.0)
MCHC: 31.3 g/dL — ABNORMAL LOW (ref 31.5–35.7)
MCV: 91 fL (ref 79–97)
Platelets: 222 10*3/uL (ref 150–450)
RBC: 3.27 x10E6/uL — ABNORMAL LOW (ref 3.77–5.28)
RDW: 13.6 % (ref 11.7–15.4)
WBC: 7.6 10*3/uL (ref 3.4–10.8)

## 2022-04-17 LAB — GLUCOSE TOLERANCE, 2 HOURS W/ 1HR
Glucose, 1 hour: 87 mg/dL (ref 70–179)
Glucose, 2 hour: 77 mg/dL (ref 70–152)
Glucose, Fasting: 81 mg/dL (ref 70–91)

## 2022-04-17 LAB — HIV ANTIBODY (ROUTINE TESTING W REFLEX): HIV Screen 4th Generation wRfx: NONREACTIVE

## 2022-04-17 LAB — RPR: RPR Ser Ql: NONREACTIVE

## 2022-04-18 ENCOUNTER — Ambulatory Visit: Payer: Medicaid Other | Attending: Obstetrics and Gynecology

## 2022-04-18 ENCOUNTER — Other Ambulatory Visit: Payer: Self-pay | Admitting: Obstetrics and Gynecology

## 2022-04-18 ENCOUNTER — Other Ambulatory Visit: Payer: Self-pay | Admitting: *Deleted

## 2022-04-18 DIAGNOSIS — O43123 Velamentous insertion of umbilical cord, third trimester: Secondary | ICD-10-CM | POA: Insufficient documentation

## 2022-04-18 DIAGNOSIS — Z3481 Encounter for supervision of other normal pregnancy, first trimester: Secondary | ICD-10-CM

## 2022-04-18 DIAGNOSIS — Z363 Encounter for antenatal screening for malformations: Secondary | ICD-10-CM | POA: Diagnosis not present

## 2022-04-18 DIAGNOSIS — Z3A28 28 weeks gestation of pregnancy: Secondary | ICD-10-CM | POA: Insufficient documentation

## 2022-04-18 DIAGNOSIS — Z3689 Encounter for other specified antenatal screening: Secondary | ICD-10-CM | POA: Insufficient documentation

## 2022-04-18 NOTE — Addendum Note (Signed)
Addended by: Levie Heritage on: 04/18/2022 05:13 PM   Modules accepted: Orders

## 2022-04-23 ENCOUNTER — Encounter (HOSPITAL_COMMUNITY): Payer: Medicaid Other

## 2022-05-01 ENCOUNTER — Encounter: Payer: Self-pay | Admitting: Obstetrics and Gynecology

## 2022-05-01 ENCOUNTER — Ambulatory Visit (INDEPENDENT_AMBULATORY_CARE_PROVIDER_SITE_OTHER): Payer: Medicaid Other | Admitting: Obstetrics and Gynecology

## 2022-05-01 VITALS — BP 129/75 | HR 130 | Wt 171.0 lb

## 2022-05-01 DIAGNOSIS — O99019 Anemia complicating pregnancy, unspecified trimester: Secondary | ICD-10-CM | POA: Insufficient documentation

## 2022-05-01 DIAGNOSIS — Z3009 Encounter for other general counseling and advice on contraception: Secondary | ICD-10-CM | POA: Insufficient documentation

## 2022-05-01 DIAGNOSIS — Z8632 Personal history of gestational diabetes: Secondary | ICD-10-CM

## 2022-05-01 DIAGNOSIS — Z3480 Encounter for supervision of other normal pregnancy, unspecified trimester: Secondary | ICD-10-CM

## 2022-05-01 DIAGNOSIS — Z3A30 30 weeks gestation of pregnancy: Secondary | ICD-10-CM

## 2022-05-01 DIAGNOSIS — Z3483 Encounter for supervision of other normal pregnancy, third trimester: Secondary | ICD-10-CM

## 2022-05-01 DIAGNOSIS — O99013 Anemia complicating pregnancy, third trimester: Secondary | ICD-10-CM

## 2022-05-01 NOTE — Patient Instructions (Signed)

## 2022-05-01 NOTE — Progress Notes (Signed)
Subjective:  Jenisha Faison is a 26 y.o. [id number] at [redacted]w[redacted]d being seen today for ongoing prenatal care.  She is currently monitored for the following issues for this low-risk pregnancy and has Supervision of other normal pregnancy, antepartum; History of gestational diabetes; Anemia affecting pregnancy; and Unwanted fertility on their problem list.  Patient reports no complaints.  Contractions: Not present. Vag. Bleeding: None.  Movement: Present. Denies leaking of fluid.   The following portions of the patient's history were reviewed and updated as appropriate: allergies, current medications, past family history, past medical history, past social history, past surgical history and problem list. Problem list updated.  Objective:   Vitals:   05/01/22 1038  BP: 129/75  Pulse: (!) 130  Weight: 77.6 kg    Fetal Status:     Movement: Present     General:  Alert, oriented and cooperative. Patient is in no acute distress.  Skin: Skin is warm and dry. No rash noted.   Cardiovascular: Normal heart rate noted  Respiratory: Normal respiratory effort, no problems with respiration noted  Abdomen: Soft, gravid, appropriate for gestational age. Pain/Pressure: Absent     Pelvic:  Cervical exam deferred        Extremities: Normal range of motion.  Edema: None  Mental Status: Normal mood and affect. Normal behavior. Normal judgment and thought content.   Urinalysis:      Assessment and Plan:  Pregnancy: [id number] at [redacted]w[redacted]d  1. Supervision of other normal pregnancy, antepartum Stable Growth scan later this month  2. Anemia affecting pregnancy in third trimester Iron infusions x 3 ordered  3. Unwanted fertility BTL papers signed  4. History of gestational diabetes Normal Glucola  Preterm labor symptoms and general obstetric precautions including but not limited to vaginal bleeding, contractions, leaking of fluid and fetal movement were reviewed in detail with the patient. Please refer to  After Visit Summary for other counseling recommendations.  Return in about 2 weeks (around 05/15/2022) for OB visit, face to face, any provider.   Hermina Staggers, MD

## 2022-05-14 ENCOUNTER — Encounter: Payer: Medicaid Other | Admitting: Family Medicine

## 2022-05-16 ENCOUNTER — Ambulatory Visit: Payer: Medicaid Other | Attending: Obstetrics and Gynecology

## 2022-05-16 ENCOUNTER — Ambulatory Visit: Payer: Medicaid Other

## 2022-05-27 ENCOUNTER — Inpatient Hospital Stay (HOSPITAL_COMMUNITY)
Admission: AD | Admit: 2022-05-27 | Discharge: 2022-05-27 | Disposition: A | Discharge status: Home / Self Care | Payer: Medicaid Other | Attending: Obstetrics & Gynecology | Admitting: Obstetrics & Gynecology

## 2022-05-27 ENCOUNTER — Encounter (HOSPITAL_COMMUNITY): Payer: Self-pay | Admitting: Obstetrics & Gynecology

## 2022-05-27 ENCOUNTER — Other Ambulatory Visit: Payer: Self-pay

## 2022-05-27 DIAGNOSIS — Z3A34 34 weeks gestation of pregnancy: Secondary | ICD-10-CM | POA: Insufficient documentation

## 2022-05-27 DIAGNOSIS — O4703 False labor before 37 completed weeks of gestation, third trimester: Secondary | ICD-10-CM | POA: Diagnosis not present

## 2022-05-27 DIAGNOSIS — Z3480 Encounter for supervision of other normal pregnancy, unspecified trimester: Secondary | ICD-10-CM

## 2022-05-27 LAB — URINALYSIS, ROUTINE W REFLEX MICROSCOPIC
Bilirubin Urine: NEGATIVE
Glucose, UA: NEGATIVE mg/dL
Hgb urine dipstick: NEGATIVE
Ketones, ur: NEGATIVE mg/dL
Nitrite: NEGATIVE
Protein, ur: NEGATIVE mg/dL
Specific Gravity, Urine: 1.008 (ref 1.005–1.030)
pH: 7 (ref 5.0–8.0)

## 2022-05-27 LAB — WET PREP, GENITAL
Clue Cells Wet Prep HPF POC: NONE SEEN
Sperm: NONE SEEN
Trich, Wet Prep: NONE SEEN
WBC, Wet Prep HPF POC: >=10 — AB (ref <10)
Yeast Wet Prep HPF POC: NONE SEEN

## 2022-05-27 NOTE — MAU Provider Note (Signed)
Patient Lorraine Burton is a 26 y.o. [phone]  At [redacted]w[redacted]d here with complaints of contraction pain that started yesterday and got worse overnight. She denies VB, LOF, decreased fetal movements, dysuria. She denies history of preterm birth, c/section. She denies complications in this pregnancy. She denies recent intercourse.   She states that she did a lot of walking yesterday, shopping, and that she thinks she overdid it. Her ctx at home were 10 minutes apart.  History     CSN: [id number]  Arrival date and time: 05/27/22 2202   Event Date/Time   First Provider Initiated Contact with Patient 05/27/22 [phone]      Chief Complaint  Patient presents with   Contractions   Abdominal Pain This is a new problem. The current episode started yesterday. The problem occurs intermittently. The problem has been gradually improving. The pain is located in the RLQ. The pain is at a severity of 6/10. The quality of the pain is cramping. The abdominal pain does not radiate. Pertinent negatives include no diarrhea, dysuria, fever, nausea or vomiting.    OB History     Gravida  4   Para  2   Term  2   Preterm      AB  1   Living  2      SAB  1   IAB      Ectopic      Multiple  0   Live Births  2           Past Medical History:  Diagnosis Date   Gestational diabetes    Trichimoniasis 07/06/2021   needs TOC    Past Surgical History:  Procedure Laterality Date   NO PAST SURGERIES      Family History  Problem Relation Age of Onset   Hypertension Mother     Social History   Tobacco Use   Smoking status: Never   Smokeless tobacco: Never  Vaping Use   Vaping Use: Never used  Substance Use Topics   Alcohol use: No   Drug use: No    Allergies: No Known Allergies  No medications prior to admission.    Review of Systems  Constitutional:  Negative for fever.  Gastrointestinal:  Positive for abdominal pain. Negative for diarrhea, nausea and vomiting.  Genitourinary:   Negative for dysuria.   Physical Exam   Blood pressure 102/61, pulse 87, temperature 98.2 F (36.8 C), temperature source Oral, resp. rate 18, height 5\' 8"  (1.727 m), weight 81.2 kg, SpO2 100 %, not currently breastfeeding.  Physical Exam Constitutional:      Appearance: Normal appearance.  Cardiovascular:     Rate and Rhythm: Normal rate.  Skin:    General: Skin is warm.  Neurological:     General: No focal deficit present.     Mental Status: She is alert and oriented to person, place, and time. Mental status is at baseline.  Psychiatric:        Mood and Affect: Mood normal.        Behavior: Behavior normal.  Cervix is 1/thick/ballotable.   MAU Course  Procedures  MDM -NST: 135 bpm, mod var, present acel, no decels, ctx are spaced out and appear more like uterine irratabiilty -wet prep negative -UA normal -patient slept in MAU; she declined repeat exam and she reports that she feels better Assessment and Plan   1. Supervision of other normal pregnancy, antepartum   2. Threatened premature labor in third trimester   3. [redacted]  weeks gestation of pregnancy    -keep appt tomorrow at [facility].  -return precautions reviewed, all questions answered.  -gc ct pending  Marylene Land 05/27/2022, 7:42 PM

## 2022-05-27 NOTE — Progress Notes (Signed)
GC/Chlamydia & wet prep cultures obtained by K. Kooistra, CNM °

## 2022-05-27 NOTE — MAU Note (Signed)
Lorraine Burton is a 26 y.o. at [redacted]w[redacted]d here in MAU reporting: contractions since 0400. They are at least every 10 min but has been dozing off. No bleeding or LOF. +FM  Onset of complaint: today  Pain score: 7/10  Vitals:   05/27/22 0816  BP: 115/67  Pulse: 92  Resp: 18  Temp: 98.3 F (36.8 C)  SpO2: 98%     FHT:151  Lab orders placed from triage: UA

## 2022-05-28 LAB — GC/CHLAMYDIA PROBE AMP (~~LOC~~) NOT AT ARMC
Chlamydia: NEGATIVE
Comment: NEGATIVE
Comment: NORMAL
Neisseria Gonorrhea: NEGATIVE

## 2022-05-29 ENCOUNTER — Ambulatory Visit (INDEPENDENT_AMBULATORY_CARE_PROVIDER_SITE_OTHER): Payer: Medicaid Other | Admitting: Advanced Practice Midwife

## 2022-05-29 ENCOUNTER — Encounter: Payer: Medicaid Other | Admitting: Advanced Practice Midwife

## 2022-05-29 ENCOUNTER — Encounter: Payer: Self-pay | Admitting: Advanced Practice Midwife

## 2022-05-29 VITALS — BP 120/76 | HR 114 | Wt 178.1 lb

## 2022-05-29 DIAGNOSIS — Z3483 Encounter for supervision of other normal pregnancy, third trimester: Secondary | ICD-10-CM | POA: Diagnosis not present

## 2022-05-29 DIAGNOSIS — Z3A34 34 weeks gestation of pregnancy: Secondary | ICD-10-CM

## 2022-05-29 DIAGNOSIS — Z3480 Encounter for supervision of other normal pregnancy, unspecified trimester: Secondary | ICD-10-CM

## 2022-05-29 DIAGNOSIS — O4703 False labor before 37 completed weeks of gestation, third trimester: Secondary | ICD-10-CM

## 2022-05-29 DIAGNOSIS — O4700 False labor before 37 completed weeks of gestation, unspecified trimester: Secondary | ICD-10-CM

## 2022-05-29 NOTE — Progress Notes (Addendum)
   PRENATAL VISIT NOTE  Subjective:  Lorraine Burton is a 26 y.o. [id number] at [redacted]w[redacted]d being seen today for ongoing prenatal care.  She is currently monitored for the following issues for this low-risk pregnancy and has Supervision of other normal pregnancy, antepartum; History of gestational diabetes; Anemia affecting pregnancy; and Unwanted fertility on their problem list.  Patient reports  contractions and passing mucus plug today .  Contractions: Irregular. Vag. Bleeding: Other.  Movement: Present. Denies leaking of fluid.   The following portions of the patient's history were reviewed and updated as appropriate: allergies, current medications, past family history, past medical history, past social history, past surgical history and problem list.   Objective:   Vitals:   05/29/22 1606  BP: 120/76  Pulse: (!) 114  Weight: 178 lb 1.6 oz (80.8 kg)    Fetal Status: Fetal Heart Rate (bpm): 141 Fundal Height: 34 cm Movement: Present  Presentation: Vertex  General:  Alert, oriented and cooperative. Patient is in no acute distress.  Skin: Skin is warm and dry. No rash noted.   Cardiovascular: Normal heart rate noted  Respiratory: Normal respiratory effort, no problems with respiration noted  Abdomen: Soft, gravid, appropriate for gestational age.  Pain/Pressure: Present     Pelvic: Cervical exam performed in the presence of a chaperone Dilation: 1 Effacement (%): Thick Station: -3  Extremities: Normal range of motion.  Edema: Trace  Mental Status: Normal mood and affect. Normal behavior. Normal judgment and thought content.   Assessment and Plan:  Pregnancy: [id number] at [redacted]w[redacted]d 1. Supervision of other normal pregnancy, antepartum --Anticipatory guidance about next visits/weeks of pregnancy given.   2. Preterm uterine contractions --Pt with mucus discharge, some brown tinged since this morning. Cramping every 15 minutes x 2 days --Cervix 1/thick/posterior, unchanged from MAU visit  9/4 --PTL precautions/reasons to go to MAU reviewed  3. [redacted] weeks gestation of pregnancy   Preterm labor symptoms and general obstetric precautions including but not limited to vaginal bleeding, contractions, leaking of fluid and fetal movement were reviewed in detail with the patient. Please refer to After Visit Summary for other counseling recommendations.   Return in about 2 weeks (around 06/12/2022) for Any provider, LOB.  Future Appointments  Date Time Provider Department Center  06/11/2022 10:55 AM Gerrit Heck, CNM [facility] None  06/19/2022 10:35 AM Hermina Staggers, MD [facility] None  06/25/2022 11:15 AM Adam Phenix, MD [facility] None  07/04/2022 10:35 AM Raelyn Mora, CNM [facility] None    Sharen Counter, CNM

## 2022-05-29 NOTE — Progress Notes (Signed)
Patient presents for routine prenatal visit. Pt states she lost her mucus plug this morning, and has been having irregular contractions. Mucus is a tinged-red color. No other concerns at this time.

## 2022-05-30 ENCOUNTER — Other Ambulatory Visit: Payer: Self-pay

## 2022-05-30 ENCOUNTER — Encounter (HOSPITAL_COMMUNITY): Payer: Self-pay | Admitting: Obstetrics & Gynecology

## 2022-05-30 ENCOUNTER — Inpatient Hospital Stay (HOSPITAL_COMMUNITY)
Admission: AD | Admit: 2022-05-30 | Discharge: 2022-06-01 | DRG: 805 | Disposition: A | Discharge status: Home / Self Care | Payer: Medicaid Other | Attending: Obstetrics & Gynecology | Admitting: Obstetrics & Gynecology

## 2022-05-30 DIAGNOSIS — Z8632 Personal history of gestational diabetes: Secondary | ICD-10-CM | POA: Diagnosis not present

## 2022-05-30 DIAGNOSIS — O9902 Anemia complicating childbirth: Secondary | ICD-10-CM | POA: Diagnosis present

## 2022-05-30 DIAGNOSIS — Z3A34 34 weeks gestation of pregnancy: Secondary | ICD-10-CM

## 2022-05-30 DIAGNOSIS — O43123 Velamentous insertion of umbilical cord, third trimester: Secondary | ICD-10-CM | POA: Diagnosis present

## 2022-05-30 DIAGNOSIS — O99824 Streptococcus B carrier state complicating childbirth: Secondary | ICD-10-CM | POA: Diagnosis not present

## 2022-05-30 LAB — URINALYSIS, ROUTINE W REFLEX MICROSCOPIC
Bilirubin Urine: NEGATIVE
Glucose, UA: NEGATIVE mg/dL
Ketones, ur: NEGATIVE mg/dL
Nitrite: NEGATIVE
Protein, ur: 30 mg/dL — AB
Specific Gravity, Urine: 1.023 (ref 1.005–1.030)
WBC, UA: >50 WBC/hpf — ABNORMAL HIGH (ref 0–5)
pH: 6 (ref 5.0–8.0)

## 2022-05-30 LAB — CBC
HCT: 29.1 % — ABNORMAL LOW (ref 36.0–46.0)
Hemoglobin: 8.9 g/dL — ABNORMAL LOW (ref 12.0–15.0)
MCH: 26.3 pg (ref 26.0–34.0)
MCHC: 30.6 g/dL (ref 30.0–36.0)
MCV: 85.8 fL (ref 80.0–100.0)
Platelets: 235 10*3/uL (ref 150–400)
RBC: 3.39 MIL/uL — ABNORMAL LOW (ref 3.87–5.11)
RDW: 16.2 % — ABNORMAL HIGH (ref 11.5–15.5)
WBC: 11 10*3/uL — ABNORMAL HIGH (ref 4.0–10.5)
nRBC: 0 % (ref 0.0–0.2)

## 2022-05-30 LAB — RPR: RPR Ser Ql: NONREACTIVE

## 2022-05-30 LAB — TYPE AND SCREEN
ABO/RH(D): O POS
Antibody Screen: NEGATIVE

## 2022-05-30 LAB — POCT FERN TEST: POCT Fern Test: NEGATIVE

## 2022-05-30 MED ORDER — BENZOCAINE-MENTHOL 20-0.5 % EX AERO: 1.0000 | INHALATION_SPRAY | CUTANEOUS | Status: DC | PRN

## 2022-05-30 MED ORDER — ONDANSETRON HCL 4 MG/2ML IJ SOLN: 4.0000 mg | Freq: Four times a day (QID) | INTRAMUSCULAR | Status: DC | PRN

## 2022-05-30 MED ORDER — ONDANSETRON HCL 4 MG/2ML IJ SOLN: 4.0000 mg | INTRAMUSCULAR | Status: DC | PRN

## 2022-05-30 MED ORDER — PRENATAL MULTIVITAMIN CH
1.0000 | ORAL_TABLET | Freq: Every day | ORAL | Status: DC
  Administered 2022-05-31 – 2022-06-01 (×2): 1 via ORAL
  Filled 2022-05-30 (×2): qty 1

## 2022-05-30 MED ORDER — SOD CITRATE-CITRIC ACID 500-334 MG/5ML PO SOLN: 30.0000 mL | ORAL | Status: DC | PRN

## 2022-05-30 MED ORDER — ACETAMINOPHEN 325 MG PO TABS: 650.0000 mg | ORAL_TABLET | ORAL | Status: DC | PRN

## 2022-05-30 MED ORDER — ONDANSETRON HCL 4 MG PO TABS: 4.0000 mg | ORAL_TABLET | ORAL | Status: DC | PRN

## 2022-05-30 MED ORDER — COCONUT OIL OIL: 1.0000 | TOPICAL_OIL | Status: DC | PRN

## 2022-05-30 MED ORDER — DIPHENHYDRAMINE HCL 25 MG PO CAPS: 25.0000 mg | ORAL_CAPSULE | Freq: Four times a day (QID) | ORAL | Status: DC | PRN

## 2022-05-30 MED ORDER — TETANUS-DIPHTH-ACELL PERTUSSIS 5-2.5-18.5 LF-MCG/0.5 IM SUSY: 0.5000 mL | PREFILLED_SYRINGE | Freq: Once | INTRAMUSCULAR | Status: DC

## 2022-05-30 MED ORDER — WITCH HAZEL-GLYCERIN EX PADS: 1.0000 | MEDICATED_PAD | CUTANEOUS | Status: DC | PRN

## 2022-05-30 MED ORDER — LIDOCAINE HCL (PF) 1 % IJ SOLN
30.0000 mL | INTRAMUSCULAR | Status: AC | PRN
Start: 2022-05-30 — End: 2022-05-30
  Administered 2022-05-30: 30 mL via SUBCUTANEOUS
  Filled 2022-05-30: qty 30

## 2022-05-30 MED ORDER — IBUPROFEN 600 MG PO TABS
600.0000 mg | ORAL_TABLET | Freq: Four times a day (QID) | ORAL | Status: DC
  Administered 2022-05-30 – 2022-06-01 (×8): 600 mg via ORAL
  Filled 2022-05-30 (×8): qty 1

## 2022-05-30 MED ORDER — LACTATED RINGERS IV SOLN: 500.0000 mL | INTRAVENOUS | Status: DC | PRN

## 2022-05-30 MED ORDER — DIBUCAINE (PERIANAL) 1 % EX OINT: 1.0000 | TOPICAL_OINTMENT | CUTANEOUS | Status: DC | PRN

## 2022-05-30 MED ORDER — PENICILLIN G POT IN DEXTROSE 60000 UNIT/ML IV SOLN
3.0000 10*6.[IU] | INTRAVENOUS | Status: DC
  Filled 2022-05-30: qty 50

## 2022-05-30 MED ORDER — SIMETHICONE 80 MG PO CHEW: 80.0000 mg | CHEWABLE_TABLET | ORAL | Status: DC | PRN

## 2022-05-30 MED ORDER — SENNOSIDES-DOCUSATE SODIUM 8.6-50 MG PO TABS
2.0000 | ORAL_TABLET | Freq: Every day | ORAL | Status: DC
  Administered 2022-06-01: 2 via ORAL
  Filled 2022-05-30 (×2): qty 2

## 2022-05-30 MED ORDER — OXYTOCIN-SODIUM CHLORIDE 30-0.9 UT/500ML-% IV SOLN
2.5000 [IU]/h | INTRAVENOUS | Status: DC
  Administered 2022-05-30: 2.5 [IU]/h via INTRAVENOUS
  Filled 2022-05-30: qty 500

## 2022-05-30 MED ORDER — FENTANYL CITRATE (PF) 100 MCG/2ML IJ SOLN
50.0000 ug | INTRAMUSCULAR | Status: DC | PRN
  Administered 2022-05-30: 100 ug via INTRAVENOUS
  Filled 2022-05-30: qty 2

## 2022-05-30 MED ORDER — OXYTOCIN BOLUS FROM INFUSION
333.0000 mL | Freq: Once | INTRAVENOUS | Status: AC
Start: 2022-05-30 — End: 2022-05-30
  Administered 2022-05-30: 333 mL via INTRAVENOUS

## 2022-05-30 MED ORDER — SODIUM CHLORIDE 0.9 % IV SOLN
5.0000 10*6.[IU] | Freq: Once | INTRAVENOUS | Status: AC
  Administered 2022-05-30: 5 10*6.[IU] via INTRAVENOUS
  Filled 2022-05-30: qty 5

## 2022-05-30 MED ORDER — LACTATED RINGERS IV SOLN: INTRAVENOUS | Status: DC

## 2022-05-30 NOTE — MAU Note (Addendum)
Lorraine Burton is a 26 y.o. at [redacted]w[redacted]d here in MAU reporting: been contracting for 2 days. Was 5-37min last night before she went to sleep. Woke her up through out the night. This morning when she woke up at 6, she was contracting.  She sat up and fluid came up.  Has been losing mucous plug. More leaking since initial gush, mixed with mucous..  No recent intercourse. Had appt yesterday, was told everything was ok. Was 1 cm yesterday. Is not on medication for PTL.  Onset of complaint: 0600 Pain score: 8 Vitals:   05/30/22 0733  BP: 118/74  Pulse: 99  Resp: 18  Temp: 98.4 F (36.9 C)  SpO2: 100%     FHT:138 Lab orders placed from triage:  ua, fern

## 2022-05-30 NOTE — H&P (Signed)
OBSTETRIC ADMISSION HISTORY AND PHYSICAL  Lorraine Burton is a 26 y.o. female [phone] with IUP at [redacted]w[redacted]d by 12w [place] presenting for PTL. She reports +FMs, No LOF, no VB, no blurry vision, headaches or peripheral edema, and RUQ pain.  She plans on breast feeding. She is unsure what she would like for birth control. She received her prenatal care at  [facility]    Dating: By 12wk [place] --->  Estimated Date of Delivery: 07/07/22  Sono:    @[redacted]w[redacted]d , CWD, normal anatomy, cephalic presentation, anterior left placental lie, 1202g, 27% EFW   Prenatal History/Complications:  -Anemia -Hx of GDM prior pregnancy -Marginal/velamentous cord insertion is seen    Past Medical History: Past Medical History:  Diagnosis Date   Gestational diabetes    with 2nd preg   Trichimoniasis 07/06/2021   needs TOC    Past Surgical History: Past Surgical History:  Procedure Laterality Date   NO PAST SURGERIES      Obstetrical History: OB History     Gravida  4   Para  2   Term  2   Preterm      AB  1   Living  2      SAB  1   IAB      Ectopic      Multiple  0   Live Births  2           Social History Social History   Socioeconomic History   Marital status: Single    Spouse name: Not on file   Number of children: Not on file   Years of education: Not on file   Highest education level: Not on file  Occupational History   Not on file  Tobacco Use   Smoking status: Never   Smokeless tobacco: Never  Vaping Use   Vaping Use: Never used  Substance and Sexual Activity   Alcohol use: No   Drug use: No   Sexual activity: Yes    Partners: Male    Birth control/protection: None  Other Topics Concern   Not on file  Social History Narrative   Not on file   Social Determinants of Health   Financial Resource Strain: Not on file  Food Insecurity: Not on file  Transportation Needs: Not on file  Physical Activity: Not on file  Stress: Not on file  Social Connections: Not on  file    Family History: Family History  Problem Relation Age of Onset   Hypertension Mother     Allergies: No Known Allergies  Medications Prior to Admission  Medication Sig Dispense Refill Last Dose   ferrous sulfate 325 (65 FE) MG tablet Take 1 tablet (325 mg total) by mouth daily with breakfast. 45 tablet 0 Past Week   Prenat-Fe Poly-Methfol-FA-DHA (VITAFOL ULTRA) 29-0.6-0.4-200 MG CAPS Take 1 capsule by mouth daily. 30 capsule 11 Past Week     Review of Systems   All systems reviewed and negative except as stated in HPI  Blood pressure 108/74, pulse (!) 104, temperature 98.4 F (36.9 C), temperature source Oral, resp. rate 18, height 5\' 8"  (1.727 m), weight 80.2 kg, SpO2 100 %, not currently breastfeeding. General appearance: alert, cooperative, and mild distress Lungs: normal effort Heart: elevated HR noted Abdomen: gravid Pelvic: complete, +2 Extremities: Homans sign is negative, no sign of DVT Presentation: cephalic Fetal monitoringBaseline: 125 bpm, Variability: Good {> 6 bpm), Accelerations: Reactive, and Decelerations: Early Uterine activityFrequency: Every 2 minutes Dilation: 4.5 Effacement (%): 90 Station: -  2 Exam by:: Eckstat   Prenatal labs: ABO, Rh: O/Positive/-- (04/26 1539) Antibody: Negative (04/26 1539) Rubella: 8.63 (04/26 1539) RPR: Non Reactive (07/25 0952)  HBsAg: Negative (04/26 1539)  HIV: Non Reactive (07/25 0952)  GBS: Positive/-- (10/13 1636)  1 hr Glucola 87 Genetic screening  LR Anatomy US-Marginal/velamentous cord insertion;  left lacrimal cyst   Prenatal Transfer Tool  Maternal Diabetes: No Genetic Screening: Normal Maternal Ultrasounds/Referrals: Other:Marginal/velamentous cord insertion;  left lacrimal cyst  Fetal Ultrasounds or other Referrals:  Referred to Materal Fetal Medicine  Maternal Substance Abuse:  No Significant Maternal Medications:  None Significant Maternal Lab Results:  Other:  GBS pending Number of Prenatal  Visits:greater than 3 verified prenatal visits Other Comments:  None  Results for orders placed or performed during the hospital encounter of 05/30/22 (from the past 24 hour(s))  Urinalysis, Routine w reflex microscopic Urine, Clean Catch   Collection Time: 05/30/22  7:45 AM  Result Value Ref Range   Color, Urine AMBER (A) YELLOW   APPearance CLOUDY (A) CLEAR   Specific Gravity, Urine 1.023 1.005 - 1.030   pH 6.0 5.0 - 8.0   Glucose, UA NEGATIVE NEGATIVE mg/dL   Hgb urine dipstick SMALL (A) NEGATIVE   Bilirubin Urine NEGATIVE NEGATIVE   Ketones, ur NEGATIVE NEGATIVE mg/dL   Protein, ur 30 (A) NEGATIVE mg/dL   Nitrite NEGATIVE NEGATIVE   Leukocytes,Ua LARGE (A) NEGATIVE   RBC / HPF 6-10 0 - 5 RBC/hpf   WBC, UA >50 (H) 0 - 5 WBC/hpf   Bacteria, UA MANY (A) NONE SEEN   Squamous Epithelial / LPF 11-20 0 - 5   Mucus PRESENT   Fern Test   Collection Time: 05/30/22  7:54 AM  Result Value Ref Range   POCT Fern Test Negative = intact amniotic membranes     Patient Active Problem List   Diagnosis Date Noted   Preterm labor 05/30/2022   Anemia affecting pregnancy 05/01/2022   Unwanted fertility 05/01/2022   History of gestational diabetes 01/16/2022   Supervision of other normal pregnancy, antepartum 12/25/2021    Assessment/Plan:  Lorraine Burton is a 26 y.o. [id number] at [redacted]w[redacted]d here for PTL.   #Labor:Expectantly manage for now. NICU aware per MAU provider. #Pain: Maternally supported #FWB: Cat II, early decels 2/2 head compression #ID:  GBS pending; inadequately tx 2/2 precipitous delivery #MOF: Both #MOC: BTL papers signed 7/25 but is unsure #Circ:  Yes  Marginal v. Vilamentous cord w/ PTL -send to path  [employer], DO  05/30/2022, 8:48 AM

## 2022-05-30 NOTE — Discharge Summary (Signed)
Postpartum Discharge Summary     Patient Name: Lorraine Burton DOB: 09-15-1996 MRN: [id number]  Date of admission: 05/30/2022 Delivery date:05/30/2022  Delivering provider: Gerlene Fee  Date of discharge: 05/31/2022  Admitting diagnosis: Preterm labor [O60.00] Intrauterine pregnancy: [redacted]w[redacted]d    Secondary diagnosis:  Principal Problem:   Preterm labor  Additional problems: none    Discharge diagnosis: Preterm Pregnancy Delivered and Anemia                                              Post partum procedures: none Augmentation: N/A Complications: None  Hospital course: Onset of Labor With Vaginal Delivery      26y.o. yo [id number] 364w4das admitted in Latent Labor on 05/30/2022. Patient had an uncomplicated labor course as follows:  Membrane Rupture Time/Date: 10:04 AM ,05/30/2022   Delivery Method:Vaginal, Spontaneous  Episiotomy: None  Lacerations:  2nd degree  Patient had an uncomplicated postpartum course.  She is ambulating, tolerating a regular diet, passing flatus, and urinating well. Patient is requesting to be discharge on PPD#1. Discharge insPatient is discharged home in stable condition on 05/31/22.  Newborn Data: Birth date:05/30/2022  Birth time:10:04 AM  Gender:Female  Living status:Living  Apgars:7 ,9  Weight:2360 g   Magnesium Sulfate received: No BMZ received: No Rhophylac:No MMR:No Flu: N/A   Physical exam  Vitals:   05/31/22 0056 05/31/22 0057 05/31/22 0557 05/31/22 0745  BP: (!) 104/54 121/65 (!) 104/54 107/67  Pulse: 72 72 84 82  Resp: _0 Temp: 97.7 F (36.5 C) 98 F (36.7 C) 97.7 F (36.5 C) 98.1 F (36.7 C)  TempSrc: Oral Oral Oral Oral  SpO2: 98% 98% 99% 100%  Weight:      Height:       General: alert, cooperative, and no distress Lochia: appropriate Uterine Fundus: firm Incision: N/A DVT Evaluation: No evidence of DVT seen on physical exam. Labs: Lab Results  Component Value Date   WBC 10.7 (H) 05/31/2022   HGB  8.3 (L) 05/31/2022   HCT 26.2 (L) 05/31/2022   MCV 84.2 05/31/2022   PLT 186 05/31/2022       No data to display         Edinburgh Score:    07/23/2021   10:22 AM  Edinburgh Postnatal Depression Scale Screening Tool  I have been able to laugh and see the funny side of things. 0  I have looked forward with enjoyment to things. 0  I have blamed myself unnecessarily when things went wrong. 2  I have been anxious or worried for no good reason. 1  I have felt scared or panicky for no good reason. 0  Things have been getting on top of me. 1  I have been so unhappy that I have had difficulty sleeping. 1  I have felt sad or miserable. 0  I have been so unhappy that I have been crying. 1  The thought of harming myself has occurred to me. 0  Edinburgh Postnatal Depression Scale Total 6     After visit meds:  Allergies as of 05/31/2022   No Known Allergies      Medication List     TAKE these medications    ferrous sulfate 325 (65 FE) MG tablet Take 1 tablet (325 mg total) by mouth daily with breakfast.   ibuprofen  600 MG tablet Commonly known as: ADVIL Take 1 tablet (600 mg total) by mouth every 6 (six) hours.   Vitafol Ultra 29-0.6-0.4-200 MG Caps Take 1 capsule by mouth daily.         Discharge home in stable condition Infant Feeding: Breast Infant Disposition:NICU Discharge instruction: per After Visit Summary and Postpartum booklet. Activity: Advance as tolerated. Pelvic rest for 6 weeks.  Diet: routine diet Future Appointments: Future Appointments  Date Time Provider [place]  07/22/2022  3:50 PM Tamara Monteith, Vickii Chafe, MD [place] None   Follow up Visit:  [place] Follow up.   Why: As scheduled for postpartum visit on 10/30 Contact information: [place] [id number] [phone]                Message sent to [facility] by [place] on 05/31/2022  Please  schedule this patient for a In person postpartum visit in 6 weeks with the following provider: Any provider. Additional Postpartum F/U: 2nd degree laceration   Low risk pregnancy complicated by:  PTL Delivery mode:  Vaginal, Spontaneous  Anticipated Birth Control:  Unsure   05/31/2022 Mora Bellman, MD

## 2022-05-30 NOTE — Lactation Note (Addendum)
This note was copied from a baby's chart.  NICU Lactation Consultation Note  Patient Name: Analeia Ismael [place] Today's Date: 05/30/2022 Age:28 hours  Subjective Reason for consult: Initial assessment; NICU baby; Infant < 6lbs; Late-preterm 54-36.6wks  Visited with family of 5 hours old LPI NICU female, Ms. Filippone is a P3 and reports that she has some experience breastfeeding but faced supply issues in the past with her last baby. This LC set up the DEBP in the room but mom not ready to start pumping at this time, encouraged to start pumping ASAP for the early onset of lactogenesis II. Reviewed pumping schedule, benefits of premature milk for NICU babies and anticipatory guidelines.   Objective Infant data: Mother's Current Feeding Choice: Breast Milk and Donor Milk  Infant feeding assessment Scale for Readiness: 3  Maternal data: [id number]  Vaginal, Spontaneous Significant Breast History:: minimal breast changes during the pregnancy; other than starting leaking colostrum at [redacted] weeks gestation Current breast feeding challenges:: NICU admission Previous breastfeeding challenges?: Low milk supply Does the patient have breastfeeding experience prior to this delivery?: Yes How long did the patient breastfeed?: Breastfeed her now 32 months old for 2 months Pumping frequency: q 3 hours Flange Size: 24 Risk factor for low milk supply:: prematurity WIC Program: Yes WIC Referral Sent?: Yes  Assessment Infant: In NICU  Maternal: Breast are soft  Intervention/Plan Interventions: Breast feeding basics reviewed; DEBP; Education; LC Services brochure Tools: Pump; Flanges Pump Education: Setup, frequency, and cleaning; Milk Storage  Plan of care: Encouraged to start pumping every 3 hours, ideally 8 pumping sessions/24 hours Ms. Cobin plans taking baby "Genesis" to breast once he's ready She's also aware of [facility] loaner pump from the hospital in case she gets discharged on the  weekend  No other support person at this time. All questions and concerns answered, family to contact [facility] services PRN.  Consult Status: NICU follow-up  NICU Follow-up type: New admission follow up; Maternal D/C visit; Verify onset of copious milk; Verify absence of engorgement   Zoee Heeney S Anaika Santillano 05/30/2022, 3:21 PM

## 2022-05-30 NOTE — MAU Provider Note (Signed)
History     [id number]  Arrival date and time: 05/30/22 0719    Chief Complaint  Patient presents with   Rupture of Membranes   Contractions     HPI Lorraine Burton is a 26 y.o. at [redacted]w[redacted]d, who presents for contractions.   Patient was seen two days prior in MAU for painful contractions, cervix at that time was 1/Thick/Ballotable She felt better without intervention and was discharged home She was seen the following day (yesterday) in clinic, cervix remained unchanged though was still having contractions  Today reports contractions have been ongoing and worsening since visit yesterday She woke up this morning and when she sat up thought she felt a gush of fluid Possibly still leaking No bleeding Endorses normal fetal movement   O/Positive/-- (04/26 1539)  OB History     Gravida  4   Para  2   Term  2   Preterm      AB  1   Living  2      SAB  1   IAB      Ectopic      Multiple  0   Live Births  2           Past Medical History:  Diagnosis Date   Gestational diabetes    with 2nd preg   Trichimoniasis 07/06/2021   needs TOC    Past Surgical History:  Procedure Laterality Date   NO PAST SURGERIES      Family History  Problem Relation Age of Onset   Hypertension Mother     Social History   Socioeconomic History   Marital status: Single    Spouse name: Not on file   Number of children: Not on file   Years of education: Not on file   Highest education level: Not on file  Occupational History   Not on file  Tobacco Use   Smoking status: Never   Smokeless tobacco: Never  Vaping Use   Vaping Use: Never used  Substance and Sexual Activity   Alcohol use: No   Drug use: No   Sexual activity: Yes    Partners: Male    Birth control/protection: None  Other Topics Concern   Not on file  Social History Narrative   Not on file   Social Determinants of Health   Financial Resource Strain: Not on file  Food Insecurity: Not on file   Transportation Needs: Not on file  Physical Activity: Not on file  Stress: Not on file  Social Connections: Not on file  Intimate Partner Violence: Not on file    No Known Allergies  No current facility-administered medications on file prior to encounter.   Current Outpatient Medications on File Prior to Encounter  Medication Sig Dispense Refill   ferrous sulfate 325 (65 FE) MG tablet Take 1 tablet (325 mg total) by mouth daily with breakfast. 45 tablet 0   Prenat-Fe Poly-Methfol-FA-DHA (VITAFOL ULTRA) 29-0.6-0.4-200 MG CAPS Take 1 capsule by mouth daily. 30 capsule 11     ROS Pertinent positives and negative per HPI, all others reviewed and negative  Physical Exam   BP 118/74 (BP Location: Right Arm)   Pulse 99   Temp 98.4 F (36.9 C) (Oral)   Resp 18   Ht 5\' 8"  (1.727 m)   Wt 80.2 kg   LMP  (LMP Unknown) Comment: had baby 5 months ago has not had a period. +HPt 1 month ago  SpO2 100%   BMI 26.90  kg/m   Patient Vitals for the past 24 hrs:  BP Temp Temp src Pulse Resp SpO2 Height Weight  05/30/22 0733 118/74 98.4 F (36.9 C) Oral 99 18 100 % 5\' 8"  (1.727 m) 80.2 kg    Physical Exam Constitutional:      Comments: Appears uncomfortable and painfully contracting  Eyes:     General: No scleral icterus. Cardiovascular:     Rate and Rhythm: Normal rate.  Pulmonary:     Effort: Pulmonary effort is normal. No respiratory distress.  Genitourinary:    Comments: SSE: moderate amount of thick white discharge. Cervix visually dilated ~3-4 cm with membranes still intact SVE: 4-5 cm/90%/-2     Cervical Exam Dilation: 4.5 Effacement (%): 90 Station: -2 Exam by:: Teola Felipe  Bedside Ultrasound Pt informed that the ultrasound is considered a limited OB ultrasound and is not intended to be a complete ultrasound exam.  Patient also informed that the ultrasound is not being completed with the intent of assessing for fetal or placental anomalies or any pelvic abnormalities.   Explained that the purpose of today's ultrasound is to assess for  presentation.  Patient acknowledges the purpose of the exam and the limitations of the study.    My interpretation: cephalic  FHT Baseline 140, moderate variability, +accels, no decels Toco: regular q5 min Cat: I  Labs Results for orders placed or performed during the hospital encounter of 05/30/22 (from the past 24 hour(s))  Fern Test     Status: None   Collection Time: 05/30/22  7:54 AM  Result Value Ref Range   POCT Fern Test Negative = intact amniotic membranes     Imaging No results found.  MAU Course  Procedures Lab Orders         Urinalysis, Routine w reflex microscopic Urine, Clean Catch         Fern Test    No orders of the defined types were placed in this encounter.  Imaging Orders  No imaging studies ordered today    MDM moderate  Assessment and Plan  #Preterm labor #[redacted] weeks gestation of pregnancy Patient presenting with advanced cervical dilation after several days of painful contractions, discussed admission for monitoring on L&D and likely progression to delivery, patient understands and is in agreement with the plan. Will start penicillin. L&D team and Neonatology notified.   #FWB FHT Cat I NST: Reactive   Dispo: admit to L&D.    07/30/22, MD/MPH 05/30/22 8:37 AM

## 2022-05-31 LAB — CBC
HCT: 26.2 % — ABNORMAL LOW (ref 36.0–46.0)
Hemoglobin: 8.3 g/dL — ABNORMAL LOW (ref 12.0–15.0)
MCH: 26.7 pg (ref 26.0–34.0)
MCHC: 31.7 g/dL (ref 30.0–36.0)
MCV: 84.2 fL (ref 80.0–100.0)
Platelets: 186 10*3/uL (ref 150–400)
RBC: 3.11 MIL/uL — ABNORMAL LOW (ref 3.87–5.11)
RDW: 16.4 % — ABNORMAL HIGH (ref 11.5–15.5)
WBC: 10.7 10*3/uL — ABNORMAL HIGH (ref 4.0–10.5)
nRBC: 0 % (ref 0.0–0.2)

## 2022-05-31 MED ORDER — IBUPROFEN 600 MG PO TABS: 600.0000 mg | ORAL_TABLET | Freq: Four times a day (QID) | ORAL | 0 refills | Status: AC

## 2022-05-31 NOTE — Lactation Note (Signed)
This note was copied from a baby'Burton chart.  NICU Lactation Consultation Note  Patient Name: Lorraine Burton [id number] Date: 05/31/2022 Age:28 hours  Subjective Reason for consult: Follow-up assessment; NICU baby; Infant < 6lbs; Late-preterm 41-36.6wks  Visited with family of 28 hours old LPI NICU female, Lorraine Burton reported she initiated pumping last night but has only done it twice the last 24 hours. Explained the importance of consistent pumping for the onset of lactogenesis II and to protect her supply; she voiced understanding. She said she'll try to abide to the 3 hours schedule, she was holding baby in NICU when visiting for this consult.   Objective Infant data: Mother'Burton Current Feeding Choice: Breast Milk and Donor Milk  Infant feeding assessment Scale for Readiness: 3  Maternal data: [id number]  Vaginal, Spontaneous Significant Breast History:: minimal breast changes during the pregnancy; other than starting leaking colostrum at [redacted] weeks gestation Current breast feeding challenges:: NICU admission Previous breastfeeding challenges?: Low milk supply Does the patient have breastfeeding experience prior to this delivery?: Yes How long did the patient breastfeed?: Breastfeed her now 27 months old for 2 months Pumping frequency: 2 times/24 hours Pumped volume: 0 mL (droplets) Flange Size: 24 Risk factor for low milk supply:: prematurity WIC Program: Yes WIC Referral Sent?: Yes  Assessment Infant: In NICU  Maternal: Milk volume: Normal  Intervention/Plan Interventions: Breast feeding basics reviewed; DEBP; Education Tools: Pump; Flanges Pump Education: Setup, frequency, and cleaning; Milk Storage  Plan of care: Encouraged to start pumping every 3 hours, ideally 8 pumping sessions/24 hours Lorraine Burton plans taking baby "Lorraine Burton" to breast once he'Burton ready She'Burton also aware of [facility] loaner pump from the hospital in case she gets discharged on the weekend   No other  support person at this time. All questions and concerns answered, family to contact [facility] services PRN.  Consult Status: NICU follow-up  NICU Follow-up type: Maternal D/C visit; Verify onset of copious milk; Verify absence of engorgement   Lorraine Burton Lorraine Burton 05/31/2022, 2:49 PM

## 2022-05-31 NOTE — Progress Notes (Signed)
Patient screened out for psychosocial assessment since none of the following apply:  Psychosocial stressors documented in mother or baby's chart  Gestation less than 32 weeks  Code at delivery   Infant with anomalies Please contact the Clinical Social Worker if specific needs arise, by MOB's request, or if MOB scores greater than 9/yes to question 10 on Edinburgh Postpartum Depression Screen.  Jarquis Walker, LCSW Clinical Social Worker [facility] Cell#: [phone]     

## 2022-06-01 ENCOUNTER — Ambulatory Visit: Payer: Self-pay

## 2022-06-01 LAB — CULTURE, BETA STREP (GROUP B ONLY)

## 2022-06-01 NOTE — Lactation Note (Signed)
This note was copied from a baby's chart.  NICU Lactation Consultation Note  Patient Name: Chaniyah Jahr [id number] Date: 06/01/2022 Age:28 hours  Subjective Reason for consult: Follow-up assessment; NICU baby; Infant < 6lbs; Late-preterm 34-36.6wks; Maternal discharge  Visited with mom of 28 hours old LPI NICU female, Ms. Goris reported she's getting discharged today. Reviewed discharge education, lactogenesis II, pumping schedule, pump settings and anticipatory guidelines post-discharge. She voiced she understands that she should be pumping more often but it's been challenging during hospital admission, she hopes to get into a routine once she gets home.  Objective Infant data: Mother's Current Feeding Choice: Breast Milk and Donor Milk  Infant feeding assessment Scale for Readiness: 1  Maternal data: [id number]  Vaginal, Spontaneous Pumping frequency: 4 times/24 hours Pumped volume: 0 mL (drops) Flange Size: 24 WIC Program: Yes WIC Referral Sent?: Yes Pump: Personal ([employer] (found it!))  Assessment Infant: In NICU  Maternal: Milk volume: Normal  Intervention/Plan Interventions: Breast feeding basics reviewed; DEBP; Education Tools: Pump; Flanges Pump Education: Setup, frequency, and cleaning; Milk Storage  Plan of care: Encouraged to start pumping every 3 hours, ideally 8 pumping sessions/24 hours She'll take all pump parts to baby's room after her discharge She'll switch her pump settings from initiation to expression mode once she starts getting + 20 ml of EBM combined Ms. Dente plans taking baby "Genesis" to breast once he's ready   No other support person at this time. All questions and concerns answered, family to contact [facility] services PRN.  Consult Status: NICU follow-up  NICU Follow-up type: Verify onset of copious milk; Verify absence of engorgement; Weekly NICU follow up   [employer] 06/01/2022, 12:21 PM

## 2022-06-01 NOTE — Discharge Summary (Signed)
Postpartum Discharge Summary     Patient Name: Lorraine Burton DOB: 06-Jan-1996 MRN: [id number]  Date of admission: 05/30/2022 Delivery date:05/30/2022  Delivering provider: Gerlene Fee  Date of discharge: 06/01/2022  Admitting diagnosis: Preterm labor [O60.00] Intrauterine pregnancy: [redacted]w[redacted]d    Secondary diagnosis:  Principal Problem:   Preterm labor  Additional problems: none    Discharge diagnosis: Preterm Pregnancy Delivered and Anemia                                              Post partum procedures: none Augmentation: N/A Complications: None  Hospital course: Onset of Labor With Vaginal Delivery      26y.o. yo [id number] admitted in Latent Labor on 05/30/2022. Patient had an uncomplicated labor course as follows:  Membrane Rupture Time/Date: 10:04 AM ,05/30/2022   Delivery Method:Vaginal, Spontaneous  Episiotomy: None  Lacerations:  2nd degree  Patient had an uncomplicated postpartum course.  She is ambulating, tolerating a regular diet, passing flatus, and urinating wel. Discharge instructions reviewed with the patient. Patient is discharged home in stable condition on 06/01/22.  Newborn Data: Birth date:05/30/2022  Birth time:10:04 AM  Gender:Female  Living status:Living  Apgars:7 ,9  Weight:2360 g   Magnesium Sulfate received: No BMZ received: No Rhophylac:No MMR:No Flu: N/A   Physical exam  Vitals:   05/31/22 1316 05/31/22 2006 06/01/22 0540 06/01/22 0816  BP: 111/67 111/73 111/62 112/67  Pulse: 84 93 81 81  Resp: 16 18 16 16   Temp: 98.1 F (36.7 C) 98.3 F (36.8 C) 98.3 F (36.8 C) 98.1 F (36.7 C)  TempSrc: Oral Oral Oral Oral  SpO2: 99% 100% 99% 97%  Weight:      Height:       General: alert, cooperative, and no distress Lochia: appropriate Uterine Fundus: firm Incision: N/A DVT Evaluation: No evidence of DVT seen on physical exam. Labs: Lab Results  Component Value Date   WBC 10.7 (H) 05/31/2022   HGB 8.3 (L) 05/31/2022    HCT 26.2 (L) 05/31/2022   MCV 84.2 05/31/2022   PLT 186 05/31/2022       No data to display         Edinburgh Score:    05/31/2022    1:00 PM  Edinburgh Postnatal Depression Scale Screening Tool  I have been able to laugh and see the funny side of things. 0  I have looked forward with enjoyment to things. 0  I have blamed myself unnecessarily when things went wrong. 2  I have been anxious or worried for no good reason. 1  I have felt scared or panicky for no good reason. 1  Things have been getting on top of me. 0  I have been so unhappy that I have had difficulty sleeping. 1  I have felt sad or miserable. 0  I have been so unhappy that I have been crying. 1  The thought of harming myself has occurred to me. 0  Edinburgh Postnatal Depression Scale Total 6     After visit meds:  Allergies as of 06/01/2022   No Known Allergies      Medication List     TAKE these medications    ferrous sulfate 325 (65 FE) MG tablet Take 1 tablet (325 mg total) by mouth daily with breakfast.   ibuprofen 600 MG tablet Commonly  known as: ADVIL Take 1 tablet (600 mg total) by mouth every 6 (six) hours.   Vitafol Ultra 29-0.6-0.4-200 MG Caps Take 1 capsule by mouth daily.         Discharge home in stable condition Infant Feeding: Breast Infant Disposition:NICU Discharge instruction: per After Visit Summary and Postpartum booklet. Activity: Advance as tolerated. Pelvic rest for 6 weeks.  Diet: routine diet Future Appointments: Future Appointments  Date Time Provider [place]  07/22/2022  3:50 PM Yarnell Kozloski, Vickii Chafe, MD Pump Back None   Follow up Visit:  [place] Follow up.   Why: As scheduled for postpartum visit on 10/30 Contact information: [place] [id number] [phone]                Message sent to [facility] by [place] on 06/01/2022  Please schedule this patient  for a In person postpartum visit in 6 weeks with the following provider: Any provider. Additional Postpartum F/U: 2nd degree laceration   Low risk pregnancy complicated by:  PTL Delivery mode:  Vaginal, Spontaneous  Anticipated Birth Control:  Unsure   06/01/2022 Mora Bellman, MD

## 2022-06-03 LAB — SURGICAL PATHOLOGY

## 2022-06-07 ENCOUNTER — Telehealth (HOSPITAL_COMMUNITY): Payer: Self-pay | Admitting: *Deleted

## 2022-06-07 NOTE — Telephone Encounter (Signed)
Call cannot be completed.  Duffy Rhody, RN 06-07-2022 at 2:26pm

## 2022-06-13 ENCOUNTER — Ambulatory Visit: Payer: Self-pay

## 2022-06-13 NOTE — Lactation Note (Signed)
This note was copied from a baby's chart.  NICU Lactation Consultation Note  Patient Name: Lorraine Burton HERDE'Y Date: 06/13/2022 Age:28 wk.o.  Subjective Reason for consult: Follow-up assessment; NICU baby; Infant < 6lbs; Late-preterm 55-36.6wks  Visited with family of 4 23/37 weeks old (adjusted) NICU female, [name] is a 26 P3 and has some experience breastfeeding but reported that she also faced supply issues with her last baby. She voiced that her Motif pump at home broke down (she doesn't have all the pieces) and she's been pumping with 28 hand pump, she couldn't get a WIC appt until 06/27/22. This LC called Erin [facility] [place] director and she was able to get her an appt for today at 3 pm to pick up a Symphony pump, MOB was very Patent attorney. Reviewed supply/demand, pumping schedule and feeding plan for baby "Lorraine Burton".  Objective Infant data: Mother's Current Feeding Choice: Breast Milk and Donor Milk  Infant feeding assessment Scale for Readiness: 1 Scale for Quality: 2  Maternal data: [id number]  Vaginal, Spontaneous Pumping frequency: 4 times/24 hours Pumped volume: 60 mL Flange Size: 24 WIC Program: Yes WIC Referral Sent?: Yes Pump:  (Her Motif pump broke down, she doesn't have a DEBP at home at this time)  Assessment Infant: In NICU  Maternal: Milk volume: Low  Intervention/Plan Interventions: Breast feeding basics reviewed; DEBP; Education Tools: Pump; Flanges Pump Education: Setup, frequency, and cleaning; Milk Storage  Plan of care: Encouraged to start pumping bilaterally every 3 hours, ideally 8 pumping sessions/24 hours or the closest she can get to it She'll P/U her pump at the [facility] office today 06/13/22 @ 3 pm [name] plans taking baby "Genesis" to breast, she'll call for latch assistance the next time she comes to the hospital   No other support person at this time. All questions and concerns answered, family to contact [facility] services PR[facility].  Consult  Status: NICU follow-up  NICU Follow-up type: Weekly NICU follow up; Assist with IDF-2 (Mother does not need to pre-pump before breastfeeding)   Lorraine Burton 06/13/2022, 12:27 PM

## 2022-06-19 ENCOUNTER — Encounter: Payer: Medicaid Other | Admitting: Obstetrics and Gynecology

## 2022-06-25 ENCOUNTER — Encounter: Payer: Medicaid Other | Admitting: Obstetrics & Gynecology

## 2022-07-04 ENCOUNTER — Encounter: Payer: Medicaid Other | Admitting: Obstetrics and Gynecology

## 2022-07-22 ENCOUNTER — Ambulatory Visit: Payer: Medicaid Other | Admitting: Obstetrics and Gynecology

## 2022-07-30 NOTE — Telephone Encounter (Signed)
REACHED OUT TO GET PT RESCHEDULED FOR MISSED OB APPT ON 6/20  Outgoing call

## 2022-10-16 IMAGING — US US MFM OB DETAIL+14 WK
1 series · 13 of 28 positions shown · non-contrast
Comparison: none

[Series 1: us mfm ob detail+14 wk · 13 of 105 slices shown]
[im 4/105]
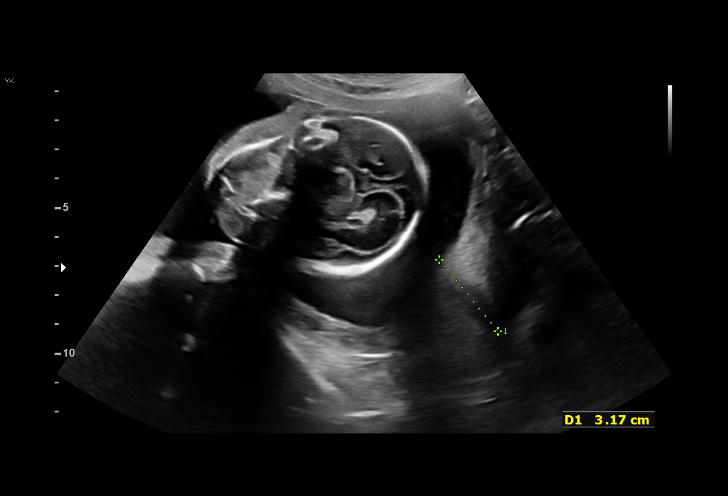
[im 12/105]
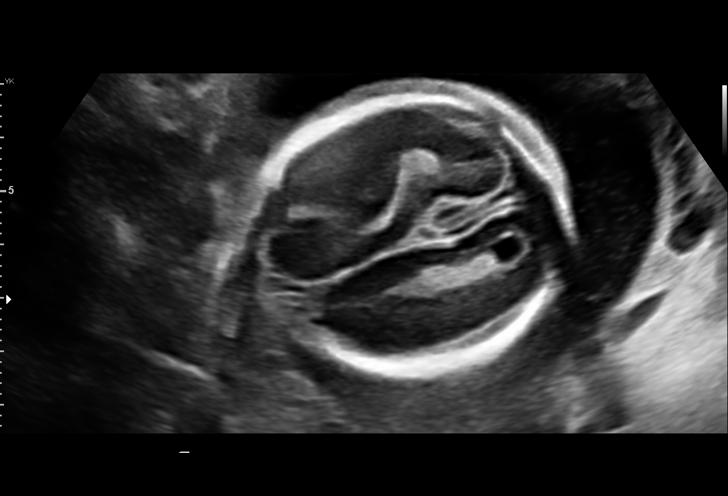
[im 20/105]
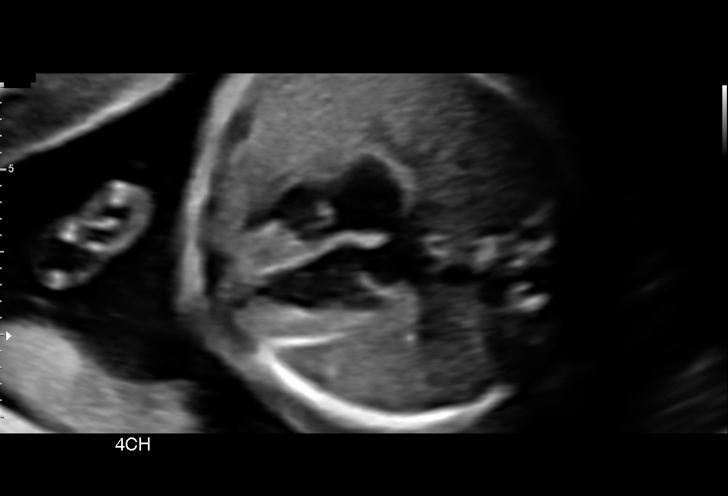
[im 27/105]
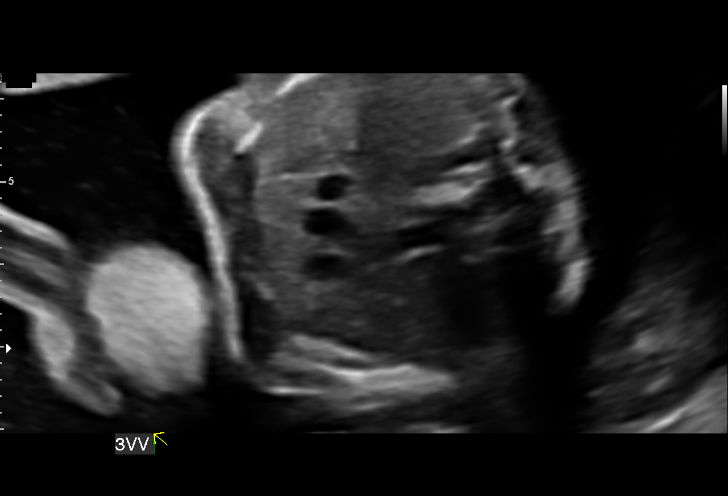
[im 35/105]
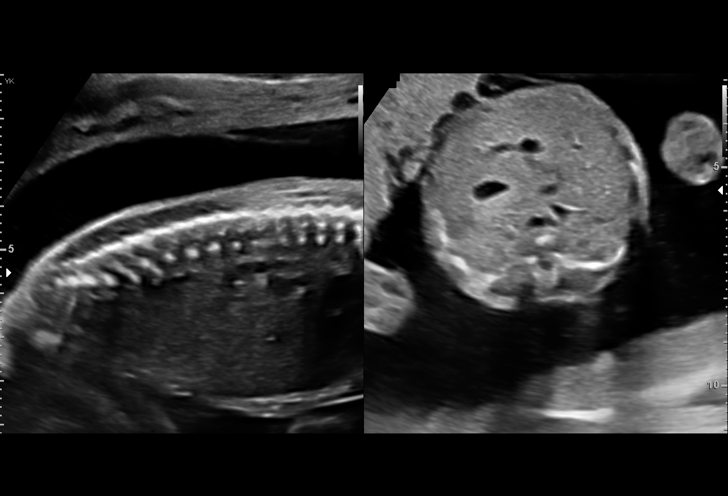
[im 43/105]
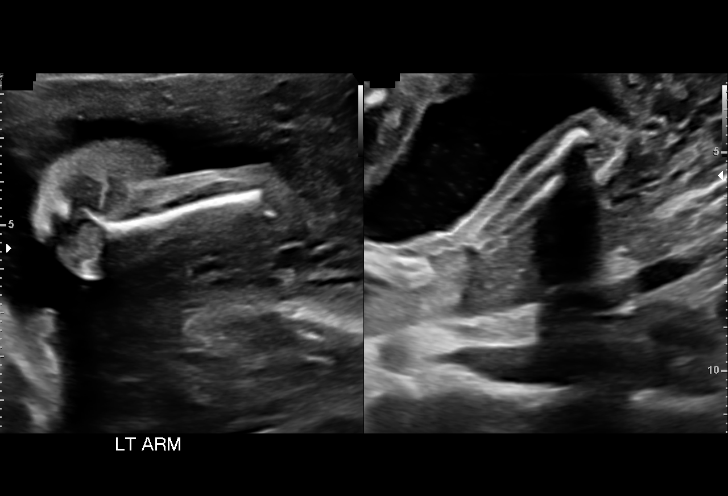
[im 54/105]
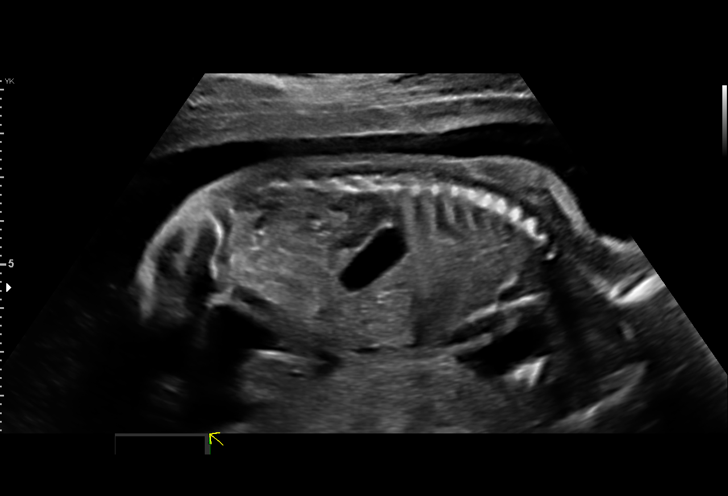
[im 62/105]
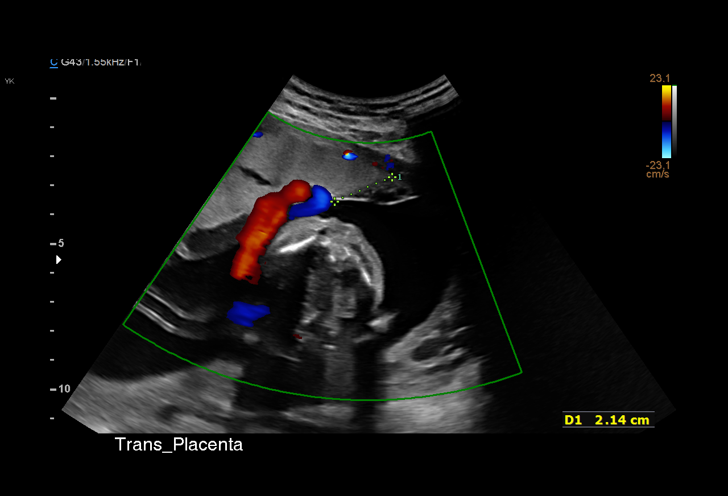
[im 70/105]
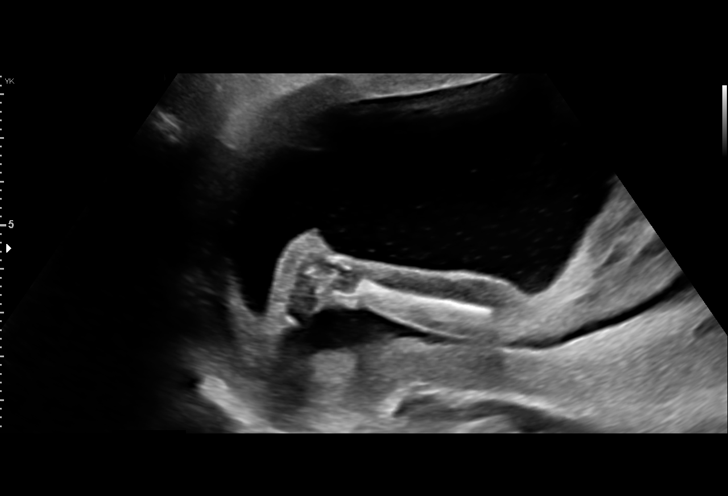
[im 78/105]
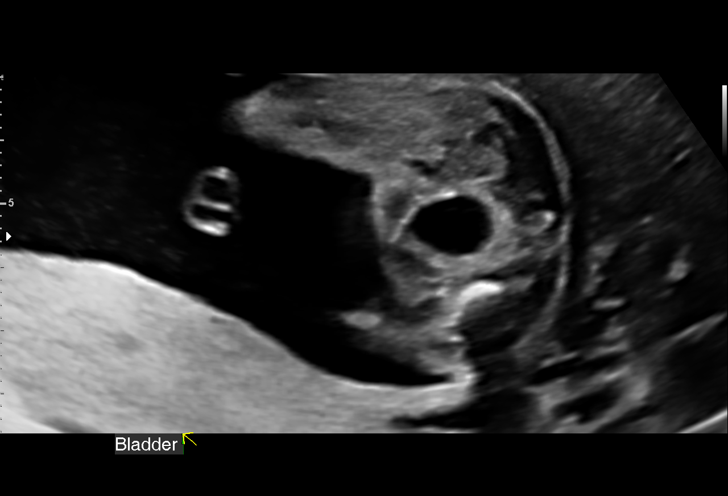
[im 85/105]
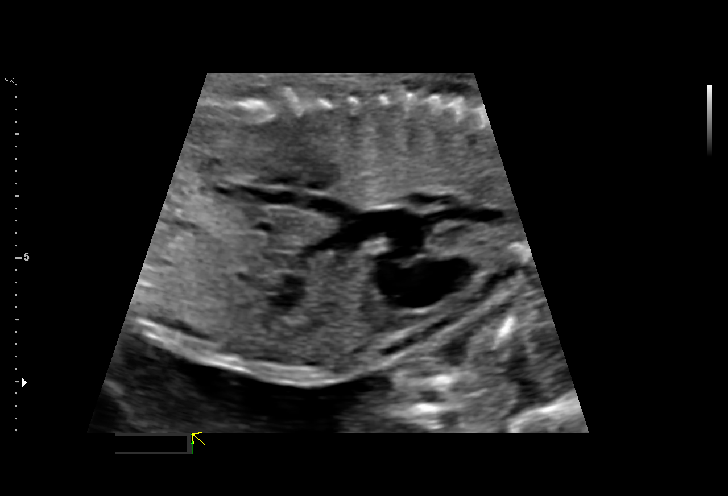
[im 93/105]
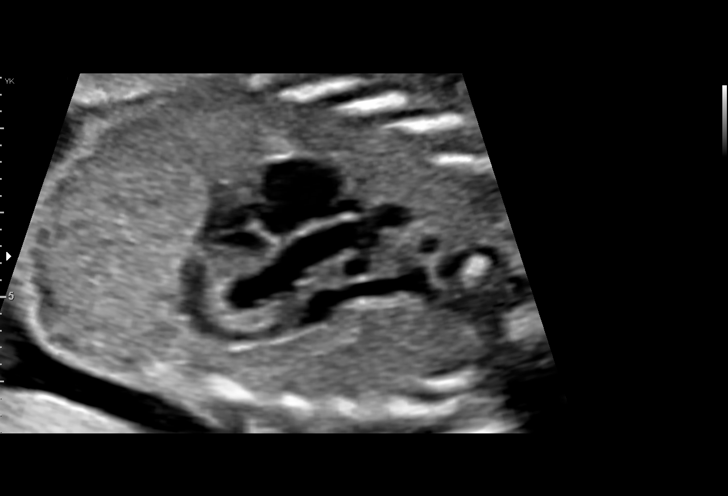
[im 101/105]
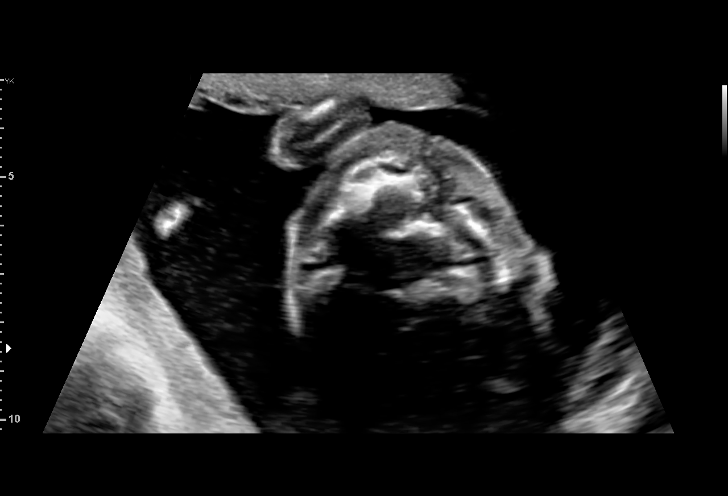

[13 of 28 positions shown; findings below may reference images not displayed]

Indications

 Encounter for antenatal screening for
 malformations
 Low risk NIPS, AFP: Neg
 Abnormal biochemical finding on antenatal
 screening of mother (SMA carrier risk)
 21 weeks gestation of pregnancy
Fetal Evaluation

 Num Of Fetuses:         1
 Fetal Heart Rate(bpm):  143
 Cardiac Activity:       Observed
 Presentation:           Cephalic
 Placenta:               Anterior
 P. Cord Insertion:      Visualized, central

 Amniotic Fluid
 AFI FV:      Within normal limits

 Comment:    No placental abruption or previa identified.
Biometry

 BPD:      52.6  mm     G. Age:  22w 0d         58  %    CI:        72.68   %    70 - 86
                                                         FL/HC:      19.3   %    18.4 -
 HC:      196.2  mm     G. Age:  21w 6d         43  %    HC/AC:      1.14        1.06 -
 AC:      171.4  mm     G. Age:  22w 1d         55  %    FL/BPD:     72.1   %    71 - 87
 FL:       37.9  mm     G. Age:  22w 1d         54  %    FL/AC:      22.1   %    20 - 24
 CER:      21.4  mm     G. Age:  20w 2d         20  %
 CM:        5.1  mm

 Est. FW:     474  gm      1 lb 1 oz     64  %
OB History

 Gravidity:    3         Term:   1
 Living:       1
Gestational Age

 LMP:           21w 5d        Date:  10/21/20                 EDD:   07/28/21
 U/S Today:     22w 0d                                        EDD:   07/26/21
 Best:          21w 5d     Det. By:  LMP  (10/21/20)          EDD:   07/28/21
Anatomy

 Cranium:               Appears normal         LVOT:                   Appears normal
 Cavum:                 Appears normal         Aortic Arch:            Appears normal
 Ventricles:            Appears normal         Ductal Arch:            Appears normal
 Choroid Plexus:        Appears normal         Diaphragm:              Appears normal
 Cerebellum:            Appears normal         Stomach:                Appears normal, left
                                                                       sided
 Posterior Fossa:       Appears normal         Abdomen:                Appears normal
 Nuchal Fold:           Not applicable (>20    Abdominal Wall:         Appears nml (cord
                        wks GA)                                        insert, abd wall)
 Face:                  Appears normal         Cord Vessels:           Appears normal (3
                        (orbits and profile)                           vessel cord)
 Lips:                  Appears normal         Kidneys:                Appear normal
 Palate:                Appears normal         Bladder:                Appears normal
 Thoracic:              Appears normal         Spine:                  Appears normal
 Heart:                 Appears normal         Upper Extremities:      Appears normal
                        (4CH, axis, and
                        situs)
 RVOT:                  Appears normal         Lower Extremities:      Appears normal

 Other:  Fetus appears to be female. Hands and feet visualized. Heels appear
         normal. Lenses visualized. VC, 3VV and 3VTV visualized.
Cervix Uterus Adnexa

 Cervix
 Length:           3.24  cm.
 Normal appearance by transabdominal scan.

 Uterus
 No abnormality visualized.

 Right Ovary
 Within normal limits.

 Left Ovary
 Within normal limits.

 Cul De Sac
 No free fluid seen.
 Adnexa
 No adnexal mass visualized.
Impression

 G3 P1. Patient is here for fetal anatomy scan. On cell-free
 fetal DNA screening, the risks of aneuploidies are not
 increased. MSAFP screening showed low risk for open-neural
 tube defects.
 Obstetric history significant for a term vaginal delivery.

 We performed a fetal anatomy scan. No markers of
 aneuploidies or fetal structural defects are seen. Fetal
 biometry is consistent with her previously-established dates.
 Amniotic fluid is normal and good fetal activity is seen.
 Patient understands the limitations of ultrasound in detecting
 fetal anomalies.

 Patient is a carrier for spinal muscular atrophy.  Her partner,
 who was present in the room, informed that he is not a carrier
 for spinal muscular atrophy.
Recommendations

 Follow-up scans as clinically indicated.
                 Alaoui, Marita

## 2023-06-11 ENCOUNTER — Ambulatory Visit: Payer: Medicaid Other | Admitting: Obstetrics and Gynecology
